# Patient Record
Sex: Female | Born: 1985
Health system: Southern US, Community
[De-identification: ages and names within clinical notes are randomized; demographics above are authoritative.]

## PROBLEM LIST (undated history)

## (undated) DIAGNOSIS — R011 Cardiac murmur, unspecified: Secondary | ICD-10-CM

## (undated) DIAGNOSIS — E079 Disorder of thyroid, unspecified: Secondary | ICD-10-CM

## (undated) DIAGNOSIS — Z8619 Personal history of other infectious and parasitic diseases: Secondary | ICD-10-CM

## (undated) DIAGNOSIS — J45909 Unspecified asthma, uncomplicated: Secondary | ICD-10-CM

## (undated) HISTORY — DX: Disorder of thyroid, unspecified: E07.9

## (undated) HISTORY — DX: Cardiac murmur, unspecified: R01.1

## (undated) HISTORY — PX: ABDOMINAL HYSTERECTOMY: SHX81

## (undated) HISTORY — DX: Unspecified asthma, uncomplicated: J45.909

## (undated) HISTORY — DX: Personal history of other infectious and parasitic diseases: Z86.19

---
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
## 2003-02-03 ENCOUNTER — Other Ambulatory Visit: Admission: RE | Admit: 2003-02-03 | Discharge: 2003-02-03 | Payer: Self-pay | Admitting: Family Medicine

## 2003-06-17 ENCOUNTER — Encounter: Admission: RE | Admit: 2003-06-17 | Discharge: 2003-06-17 | Payer: Self-pay | Admitting: Internal Medicine

## 2003-06-17 ENCOUNTER — Encounter: Payer: Self-pay | Admitting: Internal Medicine

## 2004-02-22 ENCOUNTER — Other Ambulatory Visit: Admission: RE | Admit: 2004-02-22 | Discharge: 2004-02-22 | Payer: Self-pay | Admitting: Family Medicine

## 2004-07-19 ENCOUNTER — Encounter: Admission: RE | Admit: 2004-07-19 | Discharge: 2004-07-19 | Payer: Self-pay | Admitting: Family Medicine

## 2004-10-02 ENCOUNTER — Ambulatory Visit: Payer: Self-pay | Admitting: Family Medicine

## 2005-01-15 ENCOUNTER — Ambulatory Visit: Payer: Self-pay | Admitting: Family Medicine

## 2005-02-12 DIAGNOSIS — J4520 Mild intermittent asthma, uncomplicated: Secondary | ICD-10-CM | POA: Insufficient documentation

## 2005-02-12 DIAGNOSIS — F411 Generalized anxiety disorder: Secondary | ICD-10-CM | POA: Insufficient documentation

## 2006-05-17 ENCOUNTER — Emergency Department (HOSPITAL_COMMUNITY): Admission: EM | Admit: 2006-05-17 | Discharge: 2006-05-17 | Payer: Self-pay | Admitting: Emergency Medicine

## 2007-07-08 ENCOUNTER — Ambulatory Visit: Payer: Self-pay | Admitting: Internal Medicine

## 2007-07-08 DIAGNOSIS — D649 Anemia, unspecified: Secondary | ICD-10-CM | POA: Insufficient documentation

## 2007-07-08 DIAGNOSIS — H60509 Unspecified acute noninfective otitis externa, unspecified ear: Secondary | ICD-10-CM | POA: Insufficient documentation

## 2007-07-08 DIAGNOSIS — H9209 Otalgia, unspecified ear: Secondary | ICD-10-CM | POA: Insufficient documentation

## 2007-07-08 DIAGNOSIS — H60519 Acute actinic otitis externa, unspecified ear: Secondary | ICD-10-CM | POA: Insufficient documentation

## 2007-07-10 ENCOUNTER — Encounter: Payer: Self-pay | Admitting: Internal Medicine

## 2007-07-16 ENCOUNTER — Ambulatory Visit: Payer: Self-pay | Admitting: Internal Medicine

## 2007-07-16 DIAGNOSIS — F4321 Adjustment disorder with depressed mood: Secondary | ICD-10-CM | POA: Insufficient documentation

## 2007-07-16 DIAGNOSIS — F909 Attention-deficit hyperactivity disorder, unspecified type: Secondary | ICD-10-CM | POA: Insufficient documentation

## 2007-07-16 DIAGNOSIS — E040 Nontoxic diffuse goiter: Secondary | ICD-10-CM | POA: Insufficient documentation

## 2007-07-16 LAB — CONVERTED CEMR LAB
Thyroglobulin Ab: 310.4 — ABNORMAL HIGH (ref 0.0–60.0)
Thyroperoxidase Ab SerPl-aCnc: 26981.5 — ABNORMAL HIGH (ref 0.0–60.0)
Vit D, 1,25-Dihydroxy: 29 — ABNORMAL LOW (ref 30–89)

## 2007-07-17 LAB — CONVERTED CEMR LAB
ALT: 19 units/L (ref 0–35)
AST: 20 units/L (ref 0–37)
Albumin: 4.5 g/dL (ref 3.5–5.2)
Alkaline Phosphatase: 79 units/L (ref 39–117)
BUN: 11 mg/dL (ref 6–23)
Basophils Absolute: 0 10*3/uL (ref 0.0–0.1)
Basophils Relative: 0.3 % (ref 0.0–1.0)
Bilirubin, Direct: 0.1 mg/dL (ref 0.0–0.3)
CO2: 25 meq/L (ref 19–32)
Calcium: 9.7 mg/dL (ref 8.4–10.5)
Chloride: 107 meq/L (ref 96–112)
Cholesterol: 155 mg/dL (ref 0–200)
Creatinine, Ser: 0.8 mg/dL (ref 0.4–1.2)
Eosinophils Absolute: 0.1 10*3/uL (ref 0.0–0.6)
Eosinophils Relative: 1.4 % (ref 0.0–5.0)
Ferritin: 4.9 ng/mL — ABNORMAL LOW (ref 10.0–291.0)
Free T4: 0.6 ng/dL (ref 0.6–1.6)
GFR calc Af Amer: 114 mL/min
GFR calc non Af Amer: 94 mL/min
Glucose, Bld: 78 mg/dL (ref 70–99)
HCT: 34.8 % — ABNORMAL LOW (ref 36.0–46.0)
HDL: 41.2 mg/dL (ref >39.0)
Hemoglobin: 11.8 g/dL — ABNORMAL LOW (ref 12.0–15.0)
LDL Cholesterol: 102 mg/dL — ABNORMAL HIGH (ref 0–99)
Lymphocytes Relative: 27.9 % (ref 12.0–46.0)
MCHC: 33.9 g/dL (ref 30.0–36.0)
MCV: 76.9 fL — ABNORMAL LOW (ref 78.0–100.0)
Monocytes Absolute: 0.5 10*3/uL (ref 0.2–0.7)
Monocytes Relative: 8.3 % (ref 3.0–11.0)
Neutro Abs: 3.9 10*3/uL (ref 1.4–7.7)
Neutrophils Relative %: 62.1 % (ref 43.0–77.0)
Platelets: 328 10*3/uL (ref 150–400)
Potassium: 5 meq/L (ref 3.5–5.1)
RBC: 4.52 M/uL (ref 3.87–5.11)
RDW: 16.4 % — ABNORMAL HIGH (ref 11.5–14.6)
Sodium: 140 meq/L (ref 135–145)
T3, Free: 2.7 pg/mL (ref 2.3–4.2)
TSH: 26.73 microintl units/mL — ABNORMAL HIGH (ref 0.35–5.50)
Total Bilirubin: 0.5 mg/dL (ref 0.3–1.2)
Total CHOL/HDL Ratio: 3.8
Total Protein: 7.6 g/dL (ref 6.0–8.3)
Triglycerides: 59 mg/dL (ref 0–149)
VLDL: 12 mg/dL (ref 0–40)
Vitamin B-12: 279 pg/mL (ref 211–911)
WBC: 6.3 10*3/uL (ref 4.5–10.5)

## 2007-07-22 ENCOUNTER — Telehealth: Payer: Self-pay | Admitting: Internal Medicine

## 2007-07-22 ENCOUNTER — Encounter: Payer: Self-pay | Admitting: Internal Medicine

## 2007-08-25 ENCOUNTER — Telehealth: Payer: Self-pay | Admitting: Internal Medicine

## 2007-10-02 ENCOUNTER — Ambulatory Visit: Payer: Self-pay | Admitting: Internal Medicine

## 2007-10-03 LAB — CONVERTED CEMR LAB
Free T4: 0.7 ng/dL (ref 0.6–1.6)
TSH: 5.05 microintl units/mL (ref 0.35–5.50)

## 2007-10-08 ENCOUNTER — Telehealth: Payer: Self-pay | Admitting: Internal Medicine

## 2007-10-31 ENCOUNTER — Ambulatory Visit: Payer: Self-pay | Admitting: Internal Medicine

## 2007-10-31 DIAGNOSIS — M545 Low back pain, unspecified: Secondary | ICD-10-CM | POA: Insufficient documentation

## 2007-10-31 DIAGNOSIS — B070 Plantar wart: Secondary | ICD-10-CM | POA: Insufficient documentation

## 2007-10-31 LAB — CONVERTED CEMR LAB
Bilirubin Urine: NEGATIVE
Glucose, Urine, Semiquant: NEGATIVE
Ketones, urine, test strip: NEGATIVE
Nitrite: NEGATIVE
Protein, U semiquant: NEGATIVE
Specific Gravity, Urine: 1.015
Urobilinogen, UA: 0.2
pH: 6

## 2007-11-01 ENCOUNTER — Encounter: Payer: Self-pay | Admitting: Internal Medicine

## 2007-12-18 ENCOUNTER — Ambulatory Visit: Payer: Self-pay | Admitting: Internal Medicine

## 2007-12-18 DIAGNOSIS — Z8639 Personal history of other endocrine, nutritional and metabolic disease: Secondary | ICD-10-CM | POA: Insufficient documentation

## 2007-12-18 DIAGNOSIS — IMO0002 Reserved for concepts with insufficient information to code with codable children: Secondary | ICD-10-CM | POA: Insufficient documentation

## 2007-12-24 LAB — CONVERTED CEMR LAB: TSH: 3.18 microintl units/mL (ref 0.35–5.50)

## 2008-04-29 ENCOUNTER — Emergency Department (HOSPITAL_BASED_OUTPATIENT_CLINIC_OR_DEPARTMENT_OTHER): Admission: EM | Admit: 2008-04-29 | Discharge: 2008-04-29 | Payer: Self-pay | Admitting: Emergency Medicine

## 2008-06-09 ENCOUNTER — Telehealth: Payer: Self-pay | Admitting: Internal Medicine

## 2008-07-06 ENCOUNTER — Ambulatory Visit: Payer: Self-pay | Admitting: Internal Medicine

## 2008-09-03 ENCOUNTER — Ambulatory Visit: Payer: Self-pay | Admitting: Internal Medicine

## 2008-09-24 HISTORY — PX: CHOLECYSTECTOMY: SHX55

## 2008-10-01 ENCOUNTER — Ambulatory Visit: Payer: Self-pay | Admitting: Family Medicine

## 2008-10-01 DIAGNOSIS — L219 Seborrheic dermatitis, unspecified: Secondary | ICD-10-CM | POA: Insufficient documentation

## 2008-11-12 ENCOUNTER — Ambulatory Visit: Payer: Self-pay | Admitting: Internal Medicine

## 2008-11-12 DIAGNOSIS — E069 Thyroiditis, unspecified: Secondary | ICD-10-CM | POA: Insufficient documentation

## 2008-11-12 DIAGNOSIS — R82998 Other abnormal findings in urine: Secondary | ICD-10-CM | POA: Insufficient documentation

## 2008-11-12 LAB — CONVERTED CEMR LAB
Bilirubin Urine: NEGATIVE
Glucose, Urine, Semiquant: NEGATIVE
Ketones, urine, test strip: NEGATIVE
Nitrite: NEGATIVE
Protein, U semiquant: NEGATIVE
Specific Gravity, Urine: 1.03
Urobilinogen, UA: 0.2
pH: 5

## 2008-11-13 ENCOUNTER — Encounter: Payer: Self-pay | Admitting: Internal Medicine

## 2008-11-17 LAB — CONVERTED CEMR LAB
Free T4: 0.7 ng/dL (ref 0.6–1.6)
T3, Free: 3.3 pg/mL (ref 2.3–4.2)
TSH: 2.63 microintl units/mL (ref 0.35–5.50)

## 2008-11-19 ENCOUNTER — Telehealth: Payer: Self-pay | Admitting: Family Medicine

## 2009-01-10 ENCOUNTER — Telehealth: Payer: Self-pay | Admitting: Internal Medicine

## 2009-01-12 ENCOUNTER — Ambulatory Visit: Payer: Self-pay | Admitting: Internal Medicine

## 2009-01-17 LAB — CONVERTED CEMR LAB
Free T4: 0.8 ng/dL (ref 0.6–1.6)
TSH: 2.72 microintl units/mL (ref 0.35–5.50)

## 2009-02-01 ENCOUNTER — Emergency Department (HOSPITAL_BASED_OUTPATIENT_CLINIC_OR_DEPARTMENT_OTHER): Admission: EM | Admit: 2009-02-01 | Discharge: 2009-02-01 | Payer: Self-pay | Admitting: Emergency Medicine

## 2009-05-06 ENCOUNTER — Telehealth: Payer: Self-pay | Admitting: *Deleted

## 2009-07-22 ENCOUNTER — Telehealth: Payer: Self-pay | Admitting: *Deleted

## 2009-07-22 ENCOUNTER — Encounter (INDEPENDENT_AMBULATORY_CARE_PROVIDER_SITE_OTHER): Payer: Self-pay | Admitting: *Deleted

## 2009-12-23 ENCOUNTER — Ambulatory Visit: Payer: Self-pay | Admitting: Internal Medicine

## 2009-12-23 DIAGNOSIS — R454 Irritability and anger: Secondary | ICD-10-CM | POA: Insufficient documentation

## 2009-12-23 DIAGNOSIS — F4323 Adjustment disorder with mixed anxiety and depressed mood: Secondary | ICD-10-CM | POA: Insufficient documentation

## 2009-12-23 LAB — CONVERTED CEMR LAB
Cholesterol: 112 mg/dL (ref 0–200)
HDL: 43.8 mg/dL (ref >39.00)
Hgb A1c MFr Bld: 5.2 % (ref 4.6–6.5)
LDL Cholesterol: 54 mg/dL (ref 0–99)
Total CHOL/HDL Ratio: 3
Triglycerides: 70 mg/dL (ref 0.0–149.0)
VLDL: 14 mg/dL (ref 0.0–40.0)
hCG, Beta Chain, Quant, S: 1.18 milliintl units/mL

## 2009-12-26 ENCOUNTER — Telehealth: Payer: Self-pay | Admitting: Internal Medicine

## 2009-12-26 LAB — CONVERTED CEMR LAB
ALT: 19 units/L (ref 0–35)
AST: 15 units/L (ref 0–37)
Albumin: 4.3 g/dL (ref 3.5–5.2)
Alkaline Phosphatase: 43 units/L (ref 39–117)
BUN: 9 mg/dL (ref 6–23)
Basophils Absolute: 0 10*3/uL (ref 0.0–0.1)
Basophils Relative: 0.3 % (ref 0.0–3.0)
Bilirubin, Direct: 0 mg/dL (ref 0.0–0.3)
CO2: 23 meq/L (ref 19–32)
Calcium: 8.7 mg/dL (ref 8.4–10.5)
Chloride: 104 meq/L (ref 96–112)
Creatinine, Ser: 0.6 mg/dL (ref 0.4–1.2)
Eosinophils Absolute: 0.1 10*3/uL (ref 0.0–0.7)
Eosinophils Relative: 1.1 % (ref 0.0–5.0)
Free T4: 0.7 ng/dL (ref 0.6–1.6)
GFR calc non Af Amer: 128.63 mL/min (ref >60)
Glucose, Bld: 71 mg/dL (ref 70–99)
HCT: 38 % (ref 36.0–46.0)
Hemoglobin: 13 g/dL (ref 12.0–15.0)
Lymphocytes Relative: 24.8 % (ref 12.0–46.0)
Lymphs Abs: 1.4 10*3/uL (ref 0.7–4.0)
MCHC: 34.3 g/dL (ref 30.0–36.0)
MCV: 83.5 fL (ref 78.0–100.0)
Monocytes Absolute: 0.4 10*3/uL (ref 0.1–1.0)
Monocytes Relative: 7.4 % (ref 3.0–12.0)
Neutro Abs: 3.7 10*3/uL (ref 1.4–7.7)
Neutrophils Relative %: 66.4 % (ref 43.0–77.0)
Platelets: 249 10*3/uL (ref 150.0–400.0)
Potassium: 3.4 meq/L — ABNORMAL LOW (ref 3.5–5.1)
RBC: 4.55 M/uL (ref 3.87–5.11)
RDW: 13.3 % (ref 11.5–14.6)
Sodium: 132 meq/L — ABNORMAL LOW (ref 135–145)
T3, Free: 2.6 pg/mL (ref 2.3–4.2)
TSH: 2.5 microintl units/mL (ref 0.35–5.50)
Total Bilirubin: 0.3 mg/dL (ref 0.3–1.2)
Total Protein: 7.8 g/dL (ref 6.0–8.3)
WBC: 5.5 10*3/uL (ref 4.5–10.5)

## 2009-12-29 ENCOUNTER — Ambulatory Visit: Payer: Self-pay | Admitting: Licensed Clinical Social Worker

## 2009-12-30 ENCOUNTER — Telehealth: Payer: Self-pay | Admitting: *Deleted

## 2010-03-08 ENCOUNTER — Emergency Department (HOSPITAL_BASED_OUTPATIENT_CLINIC_OR_DEPARTMENT_OTHER): Admission: EM | Admit: 2010-03-08 | Discharge: 2010-03-08 | Payer: Self-pay | Admitting: Emergency Medicine

## 2010-06-06 ENCOUNTER — Telehealth: Payer: Self-pay | Admitting: *Deleted

## 2010-06-06 ENCOUNTER — Encounter: Payer: Self-pay | Admitting: *Deleted

## 2010-07-14 ENCOUNTER — Ambulatory Visit: Payer: Self-pay | Admitting: Internal Medicine

## 2010-09-24 HISTORY — PX: TUBAL LIGATION: SHX77

## 2010-10-24 NOTE — Assessment & Plan Note (Signed)
Summary: flu-shot/rcd  Nurse Visit   Allergies: 1)  ! Morphine  Orders Added: 1)  Admin 1st Vaccine [90471] 2)  Flu Vaccine 25yrs + [40347]     Flu Vaccine Consent Questions     Do you have a history of severe allergic reactions to this vaccine? no    Any prior history of allergic reactions to egg and/or gelatin? no    Do you have a sensitivity to the preservative Thimersol? no    Do you have a past history of Guillan-Barre Syndrome? no    Do you currently have an acute febrile illness? no    Have you ever had a severe reaction to latex? no    Vaccine information given and explained to patient? yes    Are you currently pregnant? no    Lot Number:[id number]   Exp Date:03/24/2011   Site Given  Left Deltoid IM Romualdo Bolk, CMA (AAMA)  July 14, 2010 1:58 PM

## 2010-10-24 NOTE — Progress Notes (Signed)
Summary: Appt with Psych on May 18th  Phone Note Call from Patient Call back at [phone]   Caller: Patient Summary of Call: Pt called to say that the first person she called was a couselor. She is going to see a psychologist on May 18th and would like to try some type of medication if possible. She did see Darl Pikes yesterday.  Appt is with Dr. Evelene Croon. Initial call taken by: Romualdo Bolk, CMA Duncan Dull),  December 30, 2009 11:51 AM  Follow-up for Phone Call        Pt aware that md is going to discuss this with Judithe Modest first. Follow-up by: Romualdo Bolk, CMA Duncan Dull),  December 30, 2009 1:08 PM  Additional Follow-up for Phone Call Additional follow up Details #1::        I spoke  with Darl Pikes and asked  on current information    I rec we try to get you on the cancellation list  per Dr Evelene Croon to get seen earlier for med  possiblity.   Still unclear what medication would be best .  No quick fixes Additional Follow-up by: Madelin Headings MD,  January 03, 2010 5:41 PM    Additional Follow-up for Phone Call Additional follow up Details #2::    Pt aware of this and will call to see about a cancellation list. Follow-up by: Romualdo Bolk, CMA (AAMA),  January 04, 2010 8:24 AM

## 2010-10-24 NOTE — Assessment & Plan Note (Signed)
Summary: DEPRESSION / CONCERNS W/ DIABETES // RS   Vital Signs:  Patient profile:   25 year old female Menstrual status:  regular LMP:     12/06/2009 Height:      66 inches Weight:      150 pounds BMI:     24.30 Pulse rate:   72 / minute BP sitting:   110 / 80  (right arm) Cuff size:   regular  Vitals Entered By: Romualdo Bolk, CMA (AAMA) (December 23, 2009 11:10 AM) CC: Discuss depression and wants to have labs done. Pt is fasting., Depression, Pt is wondering if she is bi-polar. She would like to be referred to some. LMP (date): 12/06/2009 LMP - Character: unsure IUD Menarche (age onset years): 10   Menses interval (days): 28 Menstrual flow (days): 3-4 Menstrual Status regular Enter LMP: 12/06/2009   History of Present Illness: Tracy Mejia comes in today for   above. She is here with husband and friend.  Interivew with her separate and then with them.    Problems for a long time  .    With volatility and  now  Others are    commenting on her moodiness and   inmpulsivity  and  irritability  at times  .  She says she needs help with this  .  Currently she has   Lots of responsibility .  Is a control  issue person.   Get angry and hard to deal with .    takes care of her 3 Yo and step children at times that she cannot control .    Physical     involvement.  Feels left out in marriage but  not abused.    Denies substance use abuse .  Wants to be checked for Dm as pos fam hx of such and  make sure thyroid etc ok.   past hx of rx and dex for adhd as a young child  .   Med probelms incude  hs of thyroid aby pos  thyroiditis, recurrent utis and ear oe problems .    Depression History:      The patient denies a depressed mood most of the day but notes a diminished interest in her usual daily activities.  Positive alarm features for depression include hypersomnia, fatigue (loss of energy), feelings of worthlessness (guilt), impaired concentration (indecisiveness), and recurrent  thoughts of death or suicide.  However, she denies significant weight loss and significant weight gain.  Positive alarm features for a manic disorder include persistently & abnormally elevated mood, abnormally & persistently irritable mood, distractibility, psychomotor agitation, and excessive sexual indiscretions.  She denies less need for sleep, talkative or feels need to keep talking, flight of ideas, increase in goal-directed activity, inflated self-esteem or grandiosity, excessive buying sprees, and excessive foolish business investments.        Risk factors for depression include a family history of depression.  Suicide risk questions reveal that she does not feel like life is worth living, she wishes that she were dead, and she has thought about ending her life.  The patient denies that she has planned how to end her life.  Due to her current symptoms, it often takes extra effort to do the things she needs to do.        Comments:  Pt has thought killing can't do it because of her child. Pt has also gets angry easy for no reason. .    Preventive Screening-Counseling & Management  Alcohol-Tobacco     Alcohol drinks/day: 0     Smoking Status: quit     Year Quit: 2007  Caffeine-Diet-Exercise     Caffeine use/day: 4-5     Does Patient Exercise: no  Current Medications (verified): 1)  Levothyroxine Sodium 112 Mcg Tabs (Levothyroxine Sodium) .Marland Kitchen.. 1 By Mouth Once Daily  Allergies (verified): 1)  ! Morphine  Past History:  Past medical, surgical, family and social histories (including risk factors) reviewed, and no changes noted (except as noted below).  Past Medical History: Anemia-NOS heart murmur probably functional childbirth times one Thyroiditis Dx of ADHD as a child  ( no records)  [facility]  Past Surgical History: Reviewed history from 07/08/2007 and no changes required. wisdom teeth  Past History:  Care Management: Gynecology: [facility]  Family History: Reviewed history from 11/12/2008 and no changes required. Family History Depression Family History Diabetes 1st degree relative Family History High cholesterol Family History Hypertension Family History of Melanoma Family History Other cancer Brain Family History of Prostate CA 1st degree relative <50   Social History: Reviewed history from 11/12/2008 and no changes required. Occupation: Homemaker Married  one child   step children  in home at times  Former Smoker Alcohol use-no Drug use-no Regular exercise-no  Caffeine use/day:  4-5  Review of Systems       The patient complains of depression.  The patient denies anorexia, fever, weight loss, weight gain, vision loss, decreased hearing, hoarseness, chest pain, syncope, dyspnea on exertion, peripheral edema, prolonged cough, headaches, hemoptysis, abdominal pain, melena, hematochezia, severe indigestion/heartburn, hematuria, muscle weakness, transient blindness, difficulty walking, unusual weight change, abnormal bleeding, enlarged lymph nodes, angioedema, and breast masses.         left ear bothers her an itches at times   Physical Exam  General:  alert, well-developed, well-nourished, and well-hydrated.  tearful at times  Head:  normocephalic and atraumatic.   Eyes:  vision grossly intact, pupils equal, pupils round, and pupils reactive to light.   Ears:  R ear normal.  leaft ear with 1+ wax no redness or discharge  Nose:  no external deformity and no nasal discharge.   Mouth:  pharynx pink and moist.   Neck:  No deformities, or tenderness noted. Lungs:  Normal respiratory effort, chest expands symmetrically. Lungs are clear to auscultation, no crackles or wheezes.no dullness.   Heart:  Normal rate and regular rhythm. S1 and S2 normal without gallop, murmur, click, rub or other extra sounds.no lifts.   Abdomen:  Bowel sounds positive,abdomen soft and non-tender without masses, organomegaly or    noted. Msk:  normal ROM, no joint warmth, and no redness over joints.   Pulses:  pulses intact without delay   Extremities:  no clubbing cyanosis or edema  Neurologic:  alert & oriented X3, strength normal in all extremities, and gait normal.   Skin:  turgor normal, color normal, no ecchymoses, no petechiae, and no purpura.   Cervical Nodes:  No lymphadenopathy noted Psych:  Oriented X3, good eye contact, not agitated, not suicidal, but tearful and distressed .    nl cognition  and speech and though    Impression & Recommendations:  Problem # 1:  IRRITABILITY (HQI-696.29) r/o metabolic .   consider bipolar other  depressive rx.     Orders: TLB-A1C / Hgb A1C (Glycohemoglobin) (83036-A1C)  Problem # 2:  ADJ DISORDER WITH MIXED ANXIETY & DEPRESSED MOOD (ICD-309.28)  needs psychiatry for med consult and  counseling also .  Orders: TLB-BMP (Basic Metabolic Panel-BMET) (80048-METABOL) TLB-CBC Platelet - w/Differential (85025-CBCD) TLB-Hepatic/Liver Function Pnl (80076-HEPATIC) TLB-TSH (Thyroid Stimulating Hormone) (84443-TSH) TLB-T4 (Thyrox), Free [phone]) TLB-T3, Free (Triiodothyronine) (84481-T3FREE)  Problem # 3:  THYROIDITIS (ICD-245.9) has had nl tft s in the past year   prob not contributing that much to above problem which has been lifelong and agg by external factors  Orders: TLB-BMP (Basic Metabolic Panel-BMET) (80048-METABOL) TLB-CBC Platelet - w/Differential (85025-CBCD) TLB-Hepatic/Liver Function Pnl (80076-HEPATIC) TLB-TSH (Thyroid Stimulating Hormone) (84443-TSH) TLB-T4 (Thyrox), Free (84439-FT4R) TLB-T3, Free (Triiodothyronine) (84481-T3FREE)  Problem # 4:  ear issue  try debrox or other otc.  q hs and  if persistent and progressive  recheck    Complete Medication List: 1)  Levothyroxine Sodium 112 Mcg Tabs (Levothyroxine sodium) .Marland Kitchen.. 1 by mouth once daily  Other Orders: Venipuncture (40981) TLB-Preg Serum Quant (B-hCG) (84702-HCG-QN) TLB-Lipid Panel  (80061-LIPID) pateint not using Bc  and considering another pregnancy and child.. Advised   getting  her current situation stable before   pregnancy    consideration as things usually get worse  with this situation.  Patient Instructions: 1)  You will be informed of lab results when available.  2)  I recommend counseling . 3)  I want your  to see a psychiatrist for a medication consult. 4)  Call with appt date and we will decide to start med in the meantime   5)  some meds can make thinks worse and others not.

## 2010-10-24 NOTE — Letter (Signed)
Summary: Generic Letter  [place] [facility]  [address] [place], [place] [address]   Phone: [phone]  Fax: [phone]    06/06/2010  Attn: Dr. Sol Blazing, DOB: [birth year], does not need antibiotics prior to any dental procedures due to her heart murmur. If you have any questions, please give our office a call at [phone].  Sincerely,       Dr. Neta Mends. Panosh

## 2010-10-24 NOTE — Progress Notes (Signed)
Summary: Note saying no antibiotic is needed for heart murmur  Phone Note Call from Patient Call back at Work Phone [phone]   Caller: Patient Summary of Call: Pt needs a note from [place] saying that she doesn't need any antibiotics prior to having her braces put on or any dental treatment for her heart murmur. Can we do this?  Fax note to [phone]. Dr.Baily Initial call taken by: Romualdo Bolk, CMA Duncan Dull),  June 06, 2010 10:21 AM  Follow-up for Phone Call        yes  no need for antibioitc prophylaxis Follow-up by: Madelin Headings MD,  June 06, 2010 1:05 PM  Additional Follow-up for Phone Call Additional follow up Details #1::        Note faxed to md. Additional Follow-up by: Romualdo Bolk, CMA Duncan Dull),  June 06, 2010 1:45 PM

## 2010-10-24 NOTE — Progress Notes (Signed)
Summary: Appt with pysch on april 15th  Phone Note Call from Patient Call back at [facility] Phone [phone]   Caller: Patient Summary of Call: Pt has an appt with pysch on April 15th. She is also going to see Darl Pikes on thurs. Initial call taken by: Romualdo Bolk, CMA (AAMA),  December 26, 2009 2:01 PM

## 2010-12-10 LAB — LIPASE, BLOOD: Lipase: 29 U/L (ref 23–300)

## 2010-12-10 LAB — COMPREHENSIVE METABOLIC PANEL
ALT: 62 U/L — ABNORMAL HIGH (ref 0–35)
AST: 28 U/L (ref 0–37)
Albumin: 3.9 g/dL (ref 3.5–5.2)
Alkaline Phosphatase: 73 U/L (ref 39–117)
BUN: 11 mg/dL (ref 6–23)
CO2: 25 mEq/L (ref 19–32)
Calcium: 8.7 mg/dL (ref 8.4–10.5)
Chloride: 107 mEq/L (ref 96–112)
Creatinine, Ser: 0.6 mg/dL (ref 0.4–1.2)
GFR calc Af Amer: >60 mL/min (ref >60)
GFR calc non Af Amer: >60 mL/min (ref >60)
Glucose, Bld: 84 mg/dL (ref 70–99)
Potassium: 3.3 mEq/L — ABNORMAL LOW (ref 3.5–5.1)
Sodium: 146 mEq/L — ABNORMAL HIGH (ref 135–145)
Total Bilirubin: 0.3 mg/dL (ref 0.3–1.2)
Total Protein: 7.6 g/dL (ref 6.0–8.3)

## 2010-12-10 LAB — DIFFERENTIAL
Basophils Absolute: 0.1 10*3/uL (ref 0.0–0.1)
Basophils Relative: 2 % — ABNORMAL HIGH (ref 0–1)
Eosinophils Absolute: 0.1 10*3/uL (ref 0.0–0.7)
Eosinophils Relative: 1 % (ref 0–5)
Lymphocytes Relative: 29 % (ref 12–46)
Lymphs Abs: 1.2 10*3/uL (ref 0.7–4.0)
Monocytes Absolute: 0.5 10*3/uL (ref 0.1–1.0)
Monocytes Relative: 12 % (ref 3–12)
Neutro Abs: 2.1 10*3/uL (ref 1.7–7.7)
Neutrophils Relative %: 57 % (ref 43–77)

## 2010-12-10 LAB — URINALYSIS, ROUTINE W REFLEX MICROSCOPIC
Glucose, UA: NEGATIVE mg/dL
Hgb urine dipstick: NEGATIVE
Ketones, ur: NEGATIVE mg/dL
Nitrite: NEGATIVE
Protein, ur: NEGATIVE mg/dL
Specific Gravity, Urine: 1.029 (ref 1.005–1.030)
Urobilinogen, UA: 1 mg/dL (ref 0.0–1.0)
pH: 5.5 (ref 5.0–8.0)

## 2010-12-10 LAB — CBC
HCT: 36.1 % (ref 36.0–46.0)
Hemoglobin: 12.3 g/dL (ref 12.0–15.0)
MCHC: 34.1 g/dL (ref 30.0–36.0)
MCV: 83.5 fL (ref 78.0–100.0)
Platelets: 216 10*3/uL (ref 150–400)
RBC: 4.32 MIL/uL (ref 3.87–5.11)
RDW: 11.9 % (ref 11.5–15.5)
WBC: 4 10*3/uL (ref 4.0–10.5)

## 2010-12-10 LAB — PREGNANCY, URINE: Preg Test, Ur: NEGATIVE

## 2012-10-10 ENCOUNTER — Ambulatory Visit (INDEPENDENT_AMBULATORY_CARE_PROVIDER_SITE_OTHER): Payer: PRIVATE HEALTH INSURANCE | Admitting: Family

## 2012-10-10 ENCOUNTER — Encounter: Payer: Self-pay | Admitting: Family

## 2012-10-10 VITALS — BP 110/86 | HR 90 | Temp 97.8°F | Resp 16 | Ht 66.5 in | Wt 174.0 lb

## 2012-10-10 DIAGNOSIS — H6090 Unspecified otitis externa, unspecified ear: Secondary | ICD-10-CM

## 2012-10-10 DIAGNOSIS — Z8639 Personal history of other endocrine, nutritional and metabolic disease: Secondary | ICD-10-CM

## 2012-10-10 DIAGNOSIS — F3289 Other specified depressive episodes: Secondary | ICD-10-CM

## 2012-10-10 DIAGNOSIS — H60399 Other infective otitis externa, unspecified ear: Secondary | ICD-10-CM

## 2012-10-10 DIAGNOSIS — Z862 Personal history of diseases of the blood and blood-forming organs and certain disorders involving the immune mechanism: Secondary | ICD-10-CM

## 2012-10-10 DIAGNOSIS — R358 Other polyuria: Secondary | ICD-10-CM

## 2012-10-10 DIAGNOSIS — R51 Headache: Secondary | ICD-10-CM

## 2012-10-10 DIAGNOSIS — E039 Hypothyroidism, unspecified: Secondary | ICD-10-CM

## 2012-10-10 DIAGNOSIS — R3589 Other polyuria: Secondary | ICD-10-CM

## 2012-10-10 DIAGNOSIS — F329 Major depressive disorder, single episode, unspecified: Secondary | ICD-10-CM

## 2012-10-10 DIAGNOSIS — F32A Depression, unspecified: Secondary | ICD-10-CM

## 2012-10-10 LAB — TSH: TSH: 2.613 u[IU]/mL (ref 0.350–4.500)

## 2012-10-10 LAB — BASIC METABOLIC PANEL
BUN: 6 mg/dL (ref 6–23)
CO2: 25 mEq/L (ref 19–32)
Calcium: 9.3 mg/dL (ref 8.4–10.5)
Chloride: 105 mEq/L (ref 96–112)
Creat: 0.59 mg/dL (ref 0.50–1.10)
Glucose, Bld: 78 mg/dL (ref 70–99)
Potassium: 4 mEq/L (ref 3.5–5.3)
Sodium: 138 mEq/L (ref 135–145)

## 2012-10-10 MED ORDER — NEOMYCIN-POLYMYXIN-HC 1 % OT SOLN: 3.0000 [drp] | Freq: Four times a day (QID) | OTIC | Status: DC

## 2012-10-10 MED ORDER — VENLAFAXINE HCL ER 37.5 MG PO CP24: 37.5000 mg | ORAL_CAPSULE | Freq: Every day | ORAL | Status: DC

## 2012-10-10 NOTE — Progress Notes (Signed)
Subjective:    Patient ID: Tracy Mejia, female    DOB: 1986-07-03, 27 y.o.   MRN: [id number]  HPI  Pt presents today to establish care.  Reports some ear pain and drainage.    Hypothyroid-  Off of meds x 2 yrs.  Reports some weight gain.  Reports that her baseline weight was 160.    Depression-  Reports that she has been on lexapro and wellbutrin in the past without improvement. She has never seen psychiatry.   History of heart murmur.    Headaches- daily x 1 week.  Goes away x 1 hour.  + frontal headache.  She denies migraine history. She denies associated nausea.  Denies photo/phonophobia.  Denies nasal congestion.       Review of Systems  Constitutional: Negative for unexpected weight change.  HENT: Negative for hearing loss and congestion.   Eyes: Negative for visual disturbance.  Respiratory: Negative for cough.   Cardiovascular: Negative for leg swelling.  Gastrointestinal: Negative for nausea, vomiting and diarrhea.  Genitourinary: Negative for dysuria and frequency.  Musculoskeletal:       Joint pain in fingers/knees.  Neurological: Positive for headaches.  Hematological: Negative for adenopathy.  Psychiatric/Behavioral:       Reports tearfulness.     Past Medical History  Diagnosis Date  . Thyroid disease   . Asthma     exercise induced  . Heart murmur   . History of chicken pox     History   Social History  . Marital Status: Married    Spouse Name: N/A    Number of Children: N/A  . Years of Education: N/A   Occupational History  . Not on file.   Social History Main Topics  . Smoking status: Current Every Day Smoker    Types: Cigarettes  . Smokeless tobacco: Never Used     Comment: 4-5 (1 pack weekly) cigarettes daily  . Alcohol Use: Yes     Comment: occasional  . Drug Use: Not on file  . Sexually Active: Not on file   Other Topics Concern  . Not on file   Social History Narrative   Live husband 2 children and her parentsStay at home  momCompleted high school.  Enjoys movies/dinner out etc.    Past Surgical History  Procedure Date  . Cholecystectomy 2010  . Tubal ligation 2012  . Wisdom tooth extraction 2003    Family History  Problem Relation Age of Onset  . Diabetes Mother   . Hypertension Mother   . Cancer Mother     breast  . Stroke Father   . Factor V Leiden deficiency Father   . Cancer Paternal Grandfather     melanoma, prostate cancer  . Heart disease Neg Hx     Allergies  Allergen Reactions  . Cortisone     Pt reports swelling and discoloration at previous injection sites. Decreased pulse in foot.  . Morphine     Pt reports tension in back and difficulty moving.    Current Outpatient Prescriptions on File Prior to Visit  Medication Sig Dispense Refill  . albuterol (PROVENTIL HFA;VENTOLIN HFA) 108 (90 BASE) MCG/ACT inhaler Inhale 2 puffs into the lungs every 6 (six) hours as needed.        BP 110/86  Pulse 90  Temp 97.8 F (36.6 C) (Oral)  Resp 16  Ht 5' 6.5" (1.689 m)  Wt 174 lb (78.926 kg)  BMI 27.66 kg/m2  SpO2 97%  LMP 10/03/2012  Objective:   Physical Exam  Constitutional: She is oriented to person, place, and time. She appears well-developed and well-nourished. No distress.  HENT:  Head: Normocephalic and atraumatic.       Some yellow/white exudates in bilateral ear canals  Eyes: No scleral icterus.  Cardiovascular: Normal rate and regular rhythm.   No murmur heard. Pulmonary/Chest: Breath sounds normal. No respiratory distress. She has no wheezes. She has no rales. She exhibits no tenderness.  Musculoskeletal: She exhibits no edema.  Lymphadenopathy:    She has no cervical adenopathy.  Neurological: She is alert and oriented to person, place, and time.  Psychiatric: She has a normal mood and affect. Her behavior is normal. Judgment and thought content normal.          Assessment & Plan:

## 2012-10-10 NOTE — Patient Instructions (Addendum)
Please follow up in 1 month. Complete your blood work prior to leaving.

## 2012-10-13 ENCOUNTER — Encounter: Payer: Self-pay | Admitting: Family

## 2012-10-15 ENCOUNTER — Encounter: Payer: Self-pay | Admitting: Family

## 2012-10-15 DIAGNOSIS — R51 Headache: Secondary | ICD-10-CM

## 2012-10-15 DIAGNOSIS — F32A Depression, unspecified: Secondary | ICD-10-CM | POA: Insufficient documentation

## 2012-10-15 DIAGNOSIS — R519 Headache, unspecified: Secondary | ICD-10-CM | POA: Insufficient documentation

## 2012-10-15 DIAGNOSIS — F329 Major depressive disorder, single episode, unspecified: Secondary | ICD-10-CM | POA: Insufficient documentation

## 2012-10-15 DIAGNOSIS — H6090 Unspecified otitis externa, unspecified ear: Secondary | ICD-10-CM | POA: Insufficient documentation

## 2012-10-15 NOTE — Assessment & Plan Note (Signed)
Will rx with cortisporin otic (she tells me she is able to use hydrocortisone cream without reaction)

## 2012-10-15 NOTE — Assessment & Plan Note (Signed)
TSH WNL-  Continue to monitor off of meds.

## 2012-10-15 NOTE — Assessment & Plan Note (Signed)
Recommend that she try wearing her prescription glasses.

## 2012-10-15 NOTE — Assessment & Plan Note (Signed)
Will give trial of effexor.

## 2012-10-16 ENCOUNTER — Encounter: Payer: Self-pay | Admitting: Family

## 2012-10-17 ENCOUNTER — Encounter: Payer: Self-pay | Admitting: Family

## 2012-10-30 ENCOUNTER — Encounter: Payer: Self-pay | Admitting: Family

## 2012-10-31 NOTE — Telephone Encounter (Signed)
Please call pt and arrange follow up visit.  Please make sure she is not having any thoughts of hurting herself or others.

## 2012-10-31 NOTE — Telephone Encounter (Signed)
Spoke with pt, she denies suicidal or homicidal ideation. Scheduled pt appt for Monday at 1:30pm.

## 2012-11-03 ENCOUNTER — Encounter: Payer: Self-pay | Admitting: Family

## 2012-11-03 ENCOUNTER — Ambulatory Visit (INDEPENDENT_AMBULATORY_CARE_PROVIDER_SITE_OTHER): Payer: PRIVATE HEALTH INSURANCE | Admitting: Family

## 2012-11-03 VITALS — BP 102/80 | HR 84 | Temp 97.6°F | Resp 16 | Wt 169.0 lb

## 2012-11-03 DIAGNOSIS — F3289 Other specified depressive episodes: Secondary | ICD-10-CM

## 2012-11-03 DIAGNOSIS — F32A Depression, unspecified: Secondary | ICD-10-CM

## 2012-11-03 DIAGNOSIS — R5383 Other fatigue: Secondary | ICD-10-CM

## 2012-11-03 DIAGNOSIS — J069 Acute upper respiratory infection, unspecified: Secondary | ICD-10-CM

## 2012-11-03 DIAGNOSIS — R5381 Other malaise: Secondary | ICD-10-CM

## 2012-11-03 DIAGNOSIS — F329 Major depressive disorder, single episode, unspecified: Secondary | ICD-10-CM

## 2012-11-03 LAB — CBC WITH DIFFERENTIAL/PLATELET
Basophils Absolute: 0 10*3/uL (ref 0.0–0.1)
Basophils Relative: 1 % (ref 0–1)
Eosinophils Absolute: 0.2 10*3/uL (ref 0.0–0.7)
Eosinophils Relative: 3 % (ref 0–5)
HCT: 35.8 % — ABNORMAL LOW (ref 36.0–46.0)
Hemoglobin: 12 g/dL (ref 12.0–15.0)
Lymphocytes Relative: 24 % (ref 12–46)
Lymphs Abs: 1.5 10*3/uL (ref 0.7–4.0)
MCH: 25.3 pg — ABNORMAL LOW (ref 26.0–34.0)
MCHC: 33.5 g/dL (ref 30.0–36.0)
MCV: 75.5 fL — ABNORMAL LOW (ref 78.0–100.0)
Monocytes Absolute: 0.8 10*3/uL (ref 0.1–1.0)
Monocytes Relative: 13 % — ABNORMAL HIGH (ref 3–12)
Neutro Abs: 3.8 10*3/uL (ref 1.7–7.7)
Neutrophils Relative %: 59 % (ref 43–77)
Platelets: 294 10*3/uL (ref 150–400)
RBC: 4.74 MIL/uL (ref 3.87–5.11)
RDW: 14.9 % (ref 11.5–15.5)
WBC: 6.3 10*3/uL (ref 4.0–10.5)

## 2012-11-03 MED ORDER — VENLAFAXINE HCL 37.5 MG PO TABS: 37.5000 mg | ORAL_TABLET | Freq: Two times a day (BID) | ORAL | Status: DC

## 2012-11-03 MED ORDER — SERTRALINE HCL 50 MG PO TABS: 50.0000 mg | ORAL_TABLET | Freq: Every day | ORAL | Status: DC

## 2012-11-03 NOTE — Assessment & Plan Note (Signed)
3 day hx URI symptoms. Recommended that she call if symptoms worsen or if not resolved by the 7th day. Check CBC to exclude anemia, but I suspect that her fatigue is related to stress and URI.

## 2012-11-03 NOTE — Progress Notes (Signed)
Subjective:    Patient ID: Tracy Mejia, female    DOB: 07-22-1986, 27 y.o.   MRN: [id number]  HPI  Depression-  About 3 days after she stared the effexor she noted improvement in her mood.  She reports that about 1 week ago she started to feels tired.  Feels like she can't get enough sleep.  Denies SI/HI.    Reports 3 day hx of nasal congestion and cough.  Denies fever at home, + fatigue.   Review of Systems See HPI  Past Medical History  Diagnosis Date  . Thyroid disease   . Asthma     exercise induced  . Heart murmur   . History of chicken pox     History   Social History  . Marital Status: Married    Spouse Name: N/A    Number of Children: N/A  . Years of Education: N/A   Occupational History  . Not on file.   Social History Main Topics  . Smoking status: Current Every Day Smoker    Types: Cigarettes  . Smokeless tobacco: Never Used     Comment: 4-5 (1 pack weekly) cigarettes daily  . Alcohol Use: Yes     Comment: occasional  . Drug Use: Not on file  . Sexually Active: Not on file   Other Topics Concern  . Not on file   Social History Narrative   Live husband 2 children and her parents   Stay at home mom   Completed high school.     Enjoys movies/dinner out etc.    Past Surgical History  Procedure Laterality Date  . Cholecystectomy  2010  . Tubal ligation  2012  . Wisdom tooth extraction  2003    Family History  Problem Relation Age of Onset  . Diabetes Mother   . Hypertension Mother   . Cancer Mother     breast  . Stroke Father   . Factor V Leiden deficiency Father   . Cancer Paternal Grandfather     melanoma, prostate cancer  . Heart disease Neg Hx     Allergies  Allergen Reactions  . Cortisone     Pt reports swelling and discoloration at previous injection sites. Decreased pulse in foot.  . Morphine     Pt reports tension in back and difficulty moving.    Current Outpatient Prescriptions on File Prior to Visit  Medication Sig  Dispense Refill  . albuterol (PROVENTIL HFA;VENTOLIN HFA) 108 (90 BASE) MCG/ACT inhaler Inhale 2 puffs into the lungs every 6 (six) hours as needed.       No current facility-administered medications on file prior to visit.    BP 102/80  Pulse 84  Temp(Src) 97.6 F (36.4 C) (Oral)  Resp 16  Wt 169 lb 0.6 oz (76.676 kg)  BMI 26.88 kg/m2  SpO2 99%  LMP 10/03/2012       Objective:   Physical Exam  Constitutional: She appears well-developed and well-nourished. No distress.  HENT:  Head: Normocephalic and atraumatic.  Mouth/Throat: No posterior oropharyngeal edema or posterior oropharyngeal erythema.  Bilateral TM's occluded by cerumen.  Cardiovascular: Normal rate and regular rhythm.   No murmur heard. Pulmonary/Chest: Effort normal and breath sounds normal. No respiratory distress. She has no wheezes. She has no rales. She exhibits no tenderness.  Musculoskeletal: She exhibits no edema.  Lymphadenopathy:    She has no cervical adenopathy.  Psychiatric: Her behavior is normal. Judgment and thought content normal.  Flat affect  Assessment & Plan:

## 2012-11-03 NOTE — Assessment & Plan Note (Signed)
Still uncontrolled.  Plan to continue effexor and add sertraline.

## 2012-11-03 NOTE — Patient Instructions (Addendum)
Start sertraline 50mg - 1/2 tab once daily for the first week, then increase to a full tab on week two.   Call if nasal congestion/cough worsens or if no improvement by 1 week.  Follow up in 1 month.

## 2012-11-04 ENCOUNTER — Telehealth: Payer: Self-pay | Admitting: Family

## 2012-11-04 NOTE — Telephone Encounter (Signed)
See my chart message

## 2012-11-12 ENCOUNTER — Ambulatory Visit: Payer: PRIVATE HEALTH INSURANCE | Admitting: Family

## 2012-12-01 ENCOUNTER — Ambulatory Visit: Payer: PRIVATE HEALTH INSURANCE | Admitting: Family

## 2012-12-08 ENCOUNTER — Other Ambulatory Visit: Payer: Self-pay | Admitting: Family

## 2012-12-08 ENCOUNTER — Other Ambulatory Visit (HOSPITAL_COMMUNITY)
Admission: RE | Admit: 2012-12-08 | Discharge: 2012-12-08 | Disposition: A | Discharge status: Home / Self Care | Payer: PRIVATE HEALTH INSURANCE | Source: Ambulatory Visit | Attending: Family | Admitting: Family

## 2012-12-08 ENCOUNTER — Encounter: Payer: Self-pay | Admitting: Family

## 2012-12-08 ENCOUNTER — Ambulatory Visit (INDEPENDENT_AMBULATORY_CARE_PROVIDER_SITE_OTHER): Payer: PRIVATE HEALTH INSURANCE | Admitting: Family

## 2012-12-08 VITALS — BP 116/80 | HR 88 | Temp 97.8°F | Resp 16 | Wt 172.1 lb

## 2012-12-08 DIAGNOSIS — Z01419 Encounter for gynecological examination (general) (routine) without abnormal findings: Secondary | ICD-10-CM

## 2012-12-08 DIAGNOSIS — Z23 Encounter for immunization: Secondary | ICD-10-CM

## 2012-12-08 DIAGNOSIS — R21 Rash and other nonspecific skin eruption: Secondary | ICD-10-CM

## 2012-12-08 MED ORDER — FLUCONAZOLE 150 MG PO TABS: ORAL_TABLET | ORAL | Status: DC

## 2012-12-08 MED ORDER — ALBUTEROL SULFATE HFA 108 (90 BASE) MCG/ACT IN AERS: 2.0000 | INHALATION_SPRAY | Freq: Four times a day (QID) | RESPIRATORY_TRACT | Status: AC | PRN

## 2012-12-08 MED ORDER — SERTRALINE HCL 50 MG PO TABS: 50.0000 mg | ORAL_TABLET | Freq: Every day | ORAL | Status: DC

## 2012-12-08 NOTE — Progress Notes (Signed)
Subjective:    Patient ID: Tracy Mejia, female    DOB: 11/24/1985, 27 y.o.   MRN: [id number]  HPI  Tracy Mejia is a 27 yr old female who presents today for routine GYN exam. She reports that her last pap smear was >2 yrs ago and was normal.  She reports normal periods.   Rectal pain/itching- Reports rectal pain.  She also reports a "rash on my butt."  She reports 2 week hx of rectal pain and itching X >1 month. She has used A and D ointment which helps briefly.  She denies similar symptoms in her family members.      Review of Systems See HPI  Past Medical History  Diagnosis Date  . Thyroid disease   . Asthma     exercise induced  . Heart murmur   . History of chicken pox     History   Social History  . Marital Status: Married    Spouse Name: N/A    Number of Children: N/A  . Years of Education: N/A   Occupational History  . Not on file.   Social History Main Topics  . Smoking status: Former Smoker    Types: Cigarettes    Quit date: 11/10/2012  . Smokeless tobacco: Never Used     Comment: 4-5 (1 pack weekly) cigarettes daily  . Alcohol Use: Yes     Comment: occasional  . Drug Use: Not on file  . Sexually Active: Not on file   Other Topics Concern  . Not on file   Social History Narrative   Live husband 2 children and her parents   Stay at home mom   Completed high school.     Enjoys movies/dinner out etc.    Past Surgical History  Procedure Laterality Date  . Cholecystectomy  2010  . Tubal ligation  2012  . Wisdom tooth extraction  2003    Family History  Problem Relation Age of Onset  . Diabetes Mother   . Hypertension Mother   . Cancer Mother     breast  . Stroke Father   . Factor V Leiden deficiency Father   . Cancer Paternal Grandfather     melanoma, prostate cancer  . Heart disease Neg Hx     Allergies  Allergen Reactions  . Cortisone     Pt reports swelling and discoloration at previous injection sites. Decreased pulse in foot.  .  Morphine     Pt reports tension in back and difficulty moving.    Current Outpatient Prescriptions on File Prior to Visit  Medication Sig Dispense Refill  . sertraline (ZOLOFT) 50 MG tablet Take 1 tablet (50 mg total) by mouth daily.  30 tablet  0  . venlafaxine (EFFEXOR) 37.5 MG tablet Take 1 tablet (37.5 mg total) by mouth 2 (two) times daily.  60 tablet  2  . albuterol (PROVENTIL HFA;VENTOLIN HFA) 108 (90 BASE) MCG/ACT inhaler Inhale 2 puffs into the lungs every 6 (six) hours as needed.      . ferrous fumarate (HEMOCYTE - 106 MG FE) 325 (106 FE) MG TABS Take 1 tablet by mouth.       No current facility-administered medications on file prior to visit.    BP 116/80  Pulse 88  Temp(Src) 97.8 F (36.6 C) (Oral)  Resp 16  Wt 172 lb 1.9 oz (78.073 kg)  BMI 27.37 kg/m2  SpO2 99%  LMP 11/24/2012       Objective:   Physical Exam  Constitutional: She appears well-developed and well-nourished. No distress.  Cardiovascular: Normal rate and regular rhythm.   No murmur heard. Pulmonary/Chest: Effort normal and breath sounds normal. No respiratory distress. She has no wheezes. She has no rales. She exhibits no tenderness.  Genitourinary:  Breasts: Examined lying and sitting.  Right: Without masses, retractions, discharge or axillary adenopathy.  Left: Without masses, retractions, discharge or axillary adenopathy.  Inguinal/mons: Normal without inguinal adenopathy  External genitalia: Normal  BUS/Urethra/Skene's glands: Normal  Bladder: Normal  Vagina: Normal  Cervix: Normal  Uterus: normal in size, shape and contour. Midline and mobile  Adnexa/parametria:  Rt: Without masses or tenderness.  Lt: Without masses or tenderness.  Anus and perineum: mild erythema noted without rash on anus, no visible external hemorrhoids. Right labia near perineum skin appears pink and slightly irregular.      Skin:  Excoriated scabbed rash noted on bilateral buttocks.          Assessment &  Plan:

## 2012-12-08 NOTE — Assessment & Plan Note (Signed)
Most likely yeast. Recommended trial of diflucan. Resolving HSV infection is another possiblity. Will check HSV titers.

## 2012-12-08 NOTE — Assessment & Plan Note (Signed)
Pt counseled on BSE. Pap performed today.  

## 2012-12-08 NOTE — Patient Instructions (Addendum)
Please call if rash worsens or if it does not improve.  Complete lab work prior to leaving.

## 2012-12-09 LAB — HSV(HERPES SIMPLEX VRS) I + II AB-IGM: Herpes Simplex Vrs I&II-IgM Ab (EIA): 1.45 INDEX — ABNORMAL HIGH

## 2012-12-09 LAB — HSV 1 ANTIBODY, IGG: HSV 1 Glycoprotein G Ab, IgG: 4.35 IV — ABNORMAL HIGH

## 2012-12-09 LAB — HSV 2 ANTIBODY, IGG: HSV 2 Glycoprotein G Ab, IgG: 0.12 IV

## 2012-12-12 ENCOUNTER — Encounter: Payer: Self-pay | Admitting: Family

## 2012-12-13 ENCOUNTER — Encounter: Payer: Self-pay | Admitting: Family

## 2012-12-15 ENCOUNTER — Encounter: Payer: Self-pay | Admitting: Family

## 2012-12-15 ENCOUNTER — Telehealth: Payer: Self-pay | Admitting: Family

## 2012-12-15 NOTE — Telephone Encounter (Signed)
Opened on accident

## 2012-12-15 NOTE — Telephone Encounter (Signed)
Reviewed MyChart message.  Attempted to reach pt on phone.  Left message letting her know that I will send MyChart message and to contact me with any further questions.

## 2012-12-24 ENCOUNTER — Encounter: Payer: Self-pay | Admitting: Family

## 2013-07-30 ENCOUNTER — Other Ambulatory Visit: Payer: Self-pay

## 2014-04-09 ENCOUNTER — Telehealth: Payer: Self-pay | Admitting: Family

## 2014-04-09 NOTE — Telephone Encounter (Signed)
Patient Information:  Caller Name: Natalia  Phone: [phone]  Patient: Tracy Mejia, Tracy Mejia  Gender: Female  DOB: [birth year]  Age: 28 Years  PCP: Debbrah Alar (Adults only)  Pregnant: No  Office Follow Up:  Does the office need to follow up with this patient?: No  Instructions For The Office: N/A   Symptoms  Reason For Call & Symptoms: Patient calling about chest pain.  She reports she has knot just below collar bone and above the breast estimated size of egg.  It is made more painful with movement and breathing.  Onset pain  estimated March 2015; the knot appeared estimated June 2015.  Pain rated at 5-6 of 10.  Patient is able to talk in complete sentences/  Emergent symptoms ruled out.  See Today in Office per Chest Pain guideline due to  All other patients with chest pain.  Reviewed Health History In EMR: Yes  Reviewed Medications In EMR: Yes  Reviewed Allergies In EMR: Yes  Reviewed Surgeries / Procedures: Yes  Date of Onset of Symptoms: Unknown  Treatments Tried: Muscle relaxers, Motrin, Tylenol; has been seen in ED and had x-rays done  Treatments Tried Worked: Yes OB / GYN:  LMP: 03/29/2014  Guideline(s) Used:  Chest Pain  Skin Lesion - Moles or Growths  Disposition Per Guideline:   See Today in Office  Reason For Disposition Reached:   All other patients with chest pain  Advice Given:  Expected Course:  These mild chest pains usually disappear within 3 days.  Call Back If:  Severe chest pain  Difficulty breathing  Fever  You become worse.  Patient Refused Recommendation:  Patient Refused Care Advice  Patient relates she is not able to see provider on 04/09/14 due to lack of funds/ copay.

## 2014-04-13 ENCOUNTER — Ambulatory Visit (INDEPENDENT_AMBULATORY_CARE_PROVIDER_SITE_OTHER): Payer: PRIVATE HEALTH INSURANCE | Admitting: Family

## 2014-04-13 ENCOUNTER — Encounter: Payer: Self-pay | Admitting: Family

## 2014-04-13 VITALS — BP 102/82 | HR 76 | Temp 98.2°F | Resp 16 | Ht 66.5 in | Wt 172.0 lb

## 2014-04-13 DIAGNOSIS — Z862 Personal history of diseases of the blood and blood-forming organs and certain disorders involving the immune mechanism: Secondary | ICD-10-CM

## 2014-04-13 DIAGNOSIS — M545 Low back pain, unspecified: Secondary | ICD-10-CM

## 2014-04-13 DIAGNOSIS — Z8639 Personal history of other endocrine, nutritional and metabolic disease: Secondary | ICD-10-CM

## 2014-04-13 DIAGNOSIS — F909 Attention-deficit hyperactivity disorder, unspecified type: Secondary | ICD-10-CM

## 2014-04-13 DIAGNOSIS — F411 Generalized anxiety disorder: Secondary | ICD-10-CM | POA: Insufficient documentation

## 2014-04-13 DIAGNOSIS — L659 Nonscarring hair loss, unspecified: Secondary | ICD-10-CM

## 2014-04-13 LAB — CBC WITH DIFFERENTIAL/PLATELET
BASOS ABS: 0 10*3/uL (ref 0.0–0.1)
BASOS PCT: 0 % (ref 0–1)
EOS ABS: 0.1 10*3/uL (ref 0.0–0.7)
Eosinophils Relative: 1 % (ref 0–5)
HCT: 37.5 % (ref 36.0–46.0)
HEMOGLOBIN: 13.2 g/dL (ref 12.0–15.0)
Lymphocytes Relative: 14 % (ref 12–46)
Lymphs Abs: 1.3 10*3/uL (ref 0.7–4.0)
MCH: 28.2 pg (ref 26.0–34.0)
MCHC: 35.2 g/dL (ref 30.0–36.0)
MCV: 80.1 fL (ref 78.0–100.0)
MONOS PCT: 7 % (ref 3–12)
Monocytes Absolute: 0.6 10*3/uL (ref 0.1–1.0)
NEUTROS PCT: 78 % — AB (ref 43–77)
Neutro Abs: 7.2 10*3/uL (ref 1.7–7.7)
PLATELETS: 273 10*3/uL (ref 150–400)
RBC: 4.68 MIL/uL (ref 3.87–5.11)
RDW: 13.7 % (ref 11.5–15.5)
WBC: 9.2 10*3/uL (ref 4.0–10.5)

## 2014-04-13 LAB — TSH: TSH: 3.799 u[IU]/mL (ref 0.350–4.500)

## 2014-04-13 MED ORDER — SERTRALINE HCL 50 MG PO TABS: ORAL_TABLET | ORAL | Status: DC

## 2014-04-13 MED ORDER — MELOXICAM 7.5 MG PO TABS: 7.5000 mg | ORAL_TABLET | Freq: Every day | ORAL | Status: DC

## 2014-04-13 NOTE — Assessment & Plan Note (Signed)
+   hair loss per pt. Will obtain TSH and CBC. She is not currently on synthroid.

## 2014-04-13 NOTE — Progress Notes (Signed)
Subjective:    Patient ID: Tracy Mejia, female    DOB: 06-20-1986, 28 y.o.   MRN: [id number]  HPI  Tracy Mejia is a 28 yr old female who presents today with multiple medical concerns.   Cyst Pt reports knot on right side of chest x 1 month but notes pain in that area since February. Reports that 1 month ago. Reports chest pain is "constant"  Dizziness Pt reports intermittent episodes of dizziness, lightheadedness and feeling like she will pass out. Has happened when she is laying down.    Back Pain Pt reports back pain x 10 years and worsening x 1-2 months. Has history of arthritis and degenerative discs.reports + pain in her lower back, worse with standing.  She has been using tylenol and ibuprofen.  Working at [employer]- about 25 hrs a week.  Denies radiation of pain. Reports that her leg fell asleep the other day but no other numbness.or weakness.    ADHD-Pt requests to go back on Adderall. She has not been treated since 2005. Reports that she was diagnosed in the 3rd grade.  Reports that she has trouble remembering/focusing at work.    +hair loss. Some anxiety. Not sleeping well.  Reports poor energy.  Review of Systems    see HPI  Past Medical History  Diagnosis Date  . Thyroid disease   . Asthma     exercise induced  . Heart murmur   . History of chicken pox     History   Social History  . Marital Status: Married    Spouse Name: N/A    Number of Children: N/A  . Years of Education: N/A   Occupational History  . Not on file.   Social History Main Topics  . Smoking status: Current Some Day Smoker    Types: Cigarettes    Last Attempt to Quit: 11/10/2012  . Smokeless tobacco: Never Used     Comment: 4-5 (1 pack weekly) cigarettes daily  . Alcohol Use: Yes     Comment: occasional  . Drug Use: Not on file  . Sexual Activity: Not on file   Other Topics Concern  . Not on file   Social History Narrative   Live husband 2 children and her parents   Stay at home mom    Completed high school.     Enjoys movies/dinner out etc.    Past Surgical History  Procedure Laterality Date  . Cholecystectomy  2010  . Tubal ligation  2012  . Wisdom tooth extraction  2003    Family History  Problem Relation Age of Onset  . Diabetes Mother   . Hypertension Mother   . Cancer Mother     breast  . Stroke Father   . Factor V Leiden deficiency Father   . Cancer Paternal Grandfather     melanoma, prostate cancer  . Heart disease Neg Hx     Allergies  Allergen Reactions  . Cortisone     INJECTION ONLY. Pt reports swelling and discoloration at previous injection sites. Decreased pulse in foot.  . Morphine     Pt reports tension in back and difficulty moving.    Current Outpatient Prescriptions on File Prior to Visit  Medication Sig Dispense Refill  . albuterol (PROVENTIL HFA;VENTOLIN HFA) 108 (90 BASE) MCG/ACT inhaler Inhale 2 puffs into the lungs every 6 (six) hours as needed.  1 Inhaler  3  . ferrous fumarate (HEMOCYTE - 106 MG FE) 325 (106 FE) MG TABS  Take 1 tablet by mouth.      . fluconazole (DIFLUCAN) 150 MG tablet One tablet by mouth today, may repeat in 3 days.  2 tablet  0  . venlafaxine (EFFEXOR) 37.5 MG tablet Take 1 tablet (37.5 mg total) by mouth 2 (two) times daily.  60 tablet  2   No current facility-administered medications on file prior to visit.    BP 102/82  Pulse 76  Temp(Src) 98.2 F (36.8 C) (Oral)  Resp 16  Ht 5' 6.5" (1.689 m)  Wt 172 lb 0.6 oz (78.037 kg)  BMI 27.36 kg/m2  SpO2 97%  LMP 03/24/2014    Objective:   Physical Exam  Constitutional: She is oriented to person, place, and time. She appears well-developed and well-nourished. No distress.  Cardiovascular: Normal rate and regular rhythm.   No murmur heard. Pulmonary/Chest: Effort normal and breath sounds normal. No respiratory distress. She has no wheezes. She has no rales. She exhibits no tenderness.  Musculoskeletal:  + right anterior chest wall tenderness to  palpation, no palpable mass or cyst noted  Neurological: She is alert and oriented to person, place, and time.  Bilateral LE strength is 5/5 + straight leg raise bilaterally  Skin:  No obvious balding/hair loss noted          Assessment & Plan:

## 2014-04-13 NOTE — Progress Notes (Signed)
Pre visit review using our clinic review tool, if applicable. No additional management support is needed unless otherwise documented below in the visit note. 

## 2014-04-13 NOTE — Patient Instructions (Addendum)
Start meloxicam one tablet by mouth once daily for chest tenderness and back pain. Start zoloft 50mg - 1/2 tab by mouth once daily for 1 week, then increase to a full tab once daily on week two.  Complete lab work prior to leaving. Follow up in 1 month.

## 2014-04-13 NOTE — Assessment & Plan Note (Addendum)
Will see how she does with zoloft and improvement of her anxiety.  If memory/concentration issues persist despite zoloft, will plan to initiate adderall.

## 2014-04-13 NOTE — Assessment & Plan Note (Signed)
Will give trial of meloxicam.  If symptoms worsen or do not improve consider PT referral.

## 2014-04-13 NOTE — Assessment & Plan Note (Signed)
Deteriorated.  Has been on zoloft and effexor in the past but discontinued due to lack of insurance >1 year ago. Resume zoloft.

## 2014-04-14 ENCOUNTER — Encounter: Payer: Self-pay | Admitting: Family

## 2014-05-03 ENCOUNTER — Ambulatory Visit: Payer: Self-pay | Admitting: Family

## 2014-05-18 ENCOUNTER — Ambulatory Visit: Payer: Self-pay | Admitting: Family

## 2014-08-31 DIAGNOSIS — M13161 Monoarthritis, not elsewhere classified, right knee: Secondary | ICD-10-CM | POA: Insufficient documentation

## 2014-10-04 DIAGNOSIS — R1314 Dysphagia, pharyngoesophageal phase: Secondary | ICD-10-CM | POA: Insufficient documentation

## 2014-12-29 DIAGNOSIS — N393 Stress incontinence (female) (male): Secondary | ICD-10-CM | POA: Insufficient documentation

## 2015-01-05 DIAGNOSIS — N814 Uterovaginal prolapse, unspecified: Secondary | ICD-10-CM | POA: Insufficient documentation

## 2015-01-24 ENCOUNTER — Encounter: Payer: Self-pay | Admitting: Family

## 2015-10-05 DIAGNOSIS — G4719 Other hypersomnia: Secondary | ICD-10-CM | POA: Insufficient documentation

## 2015-10-05 DIAGNOSIS — R42 Dizziness and giddiness: Secondary | ICD-10-CM | POA: Insufficient documentation

## 2015-10-05 DIAGNOSIS — R0683 Snoring: Secondary | ICD-10-CM | POA: Insufficient documentation

## 2015-10-05 DIAGNOSIS — S8992XA Unspecified injury of left lower leg, initial encounter: Secondary | ICD-10-CM | POA: Insufficient documentation

## 2015-10-05 DIAGNOSIS — G44229 Chronic tension-type headache, not intractable: Secondary | ICD-10-CM | POA: Insufficient documentation

## 2017-08-22 DIAGNOSIS — R748 Abnormal levels of other serum enzymes: Secondary | ICD-10-CM | POA: Insufficient documentation

## 2017-12-09 DIAGNOSIS — K580 Irritable bowel syndrome with diarrhea: Secondary | ICD-10-CM | POA: Insufficient documentation

## 2017-12-09 DIAGNOSIS — K760 Fatty (change of) liver, not elsewhere classified: Secondary | ICD-10-CM | POA: Insufficient documentation

## 2018-01-28 DIAGNOSIS — F321 Major depressive disorder, single episode, moderate: Secondary | ICD-10-CM | POA: Insufficient documentation

## 2018-05-15 ENCOUNTER — Telehealth: Payer: Self-pay | Admitting: *Deleted

## 2018-05-15 NOTE — Telephone Encounter (Signed)
"  I am calling to see what time my surgery is scheduled and I have some questions."  When are you scheduled for surgery?  "I'm scheduled for August 28."  Someone from the surgical center will call you a day or two prior to your surgery date.  They will give you your arrival time.  I attempted to call patient on her mobile and work numbers.  I could not leave a message.  I tried her home number.  I left her a message that she is scheduled for an appointment here at the [facility] on Wednesday, August 28, for a consultation, not surgery.  "I received a call that I'm scheduled for a consult and not surgery.  She said I'm a new patient."  Yes, I called you back and left the message.  You are scheduled as a new patient for a consultation.  Once we see you then we can get you scheduled for surgery.  "Oh, that dumb girl said it was for my surgery."  Who did you speak with.  "I don't know her name."

## 2018-05-21 ENCOUNTER — Other Ambulatory Visit: Payer: Self-pay

## 2018-05-21 ENCOUNTER — Ambulatory Visit (INDEPENDENT_AMBULATORY_CARE_PROVIDER_SITE_OTHER): Payer: BLUE CROSS/BLUE SHIELD | Admitting: Podiatry

## 2018-05-21 DIAGNOSIS — M722 Plantar fascial fibromatosis: Secondary | ICD-10-CM

## 2018-05-21 NOTE — Patient Instructions (Signed)
Pre-Operative Instructions  Congratulations, you have decided to take an important step towards improving your quality of life.  You can be assured that the doctors and staff at [facility] will be with you every step of the way.  Here are some important things you should know:  1. Plan to be at the surgery center/hospital at least 1 (one) hour prior to your scheduled time, unless otherwise directed by the surgical center/hospital staff.  You must have a responsible adult accompany you, remain during the surgery and drive you home.  Make sure you have directions to the surgical center/hospital to ensure you arrive on time. 2. If you are having surgery at [facility] or [facility] hospitals, you will need a copy of your medical history and physical form from your family physician within one month prior to the date of surgery. We will give you a form for your primary physician to complete.  3. We make every effort to accommodate the date you request for surgery.  However, there are times where surgery dates or times have to be moved.  We will contact you as soon as possible if a change in schedule is required.   4. No aspirin/ibuprofen for one week before surgery.  If you are on aspirin, any non-steroidal anti-inflammatory medications (Mobic, Aleve, Ibuprofen) should not be taken seven (7) days prior to your surgery.  You make take Tylenol for pain prior to surgery.  5. Medications - If you are taking daily heart and blood pressure medications, seizure, reflux, allergy, asthma, anxiety, pain or diabetes medications, make sure you notify the surgery center/hospital before the day of surgery so they can tell you which medications you should take or avoid the day of surgery. 6. No food or drink after midnight the night before surgery unless directed otherwise by surgical center/hospital staff. 7. No alcoholic beverages 24-hours prior to surgery.  No smoking 24-hours prior or 24-hours after  surgery. 8. Wear loose pants or shorts. They should be loose enough to fit over bandages, boots, and casts. 9. Don't wear slip-on shoes. Sneakers are preferred. 10. Bring your boot with you to the surgery center/hospital.  Also bring crutches or a walker if your physician has prescribed it for you.  If you do not have this equipment, it will be provided for you after surgery. 11. If you have not been contacted by the surgery center/hospital by the day before your surgery, call to confirm the date and time of your surgery. 12. Leave-time from work may vary depending on the type of surgery you have.  Appropriate arrangements should be made prior to surgery with your employer. 13. Prescriptions will be provided immediately following surgery by your doctor.  Fill these as soon as possible after surgery and take the medication as directed. Pain medications will not be refilled on weekends and must be approved by the doctor. 14. Remove nail polish on the operative foot and avoid getting pedicures prior to surgery. 15. Wash the night before surgery.  The night before surgery wash the foot and leg well with water and the antibacterial soap provided. Be sure to pay special attention to beneath the toenails and in between the toes.  Wash for at least three (3) minutes. Rinse thoroughly with water and dry well with a towel.  Perform this wash unless told not to do so by your physician.  Enclosed: 1 Ice pack (please put in freezer the night before surgery)   1 Hibiclens skin cleaner     Pre-op instructions  If you have any questions regarding the instructions, please do not hesitate to call our office.  [place]: [address], [place], [place] [address] -- [phone]  [place]: [address] , [place] [address] -- [phone]  [place]: [address]  [place], [place] [address] -- [phone]  [place]: [address], [address], [place], [place] [address] -- [phone]  Website:  [email or web address] 

## 2018-05-24 NOTE — Progress Notes (Signed)
   Subjective: 32 year old female presenting today as a new patient, referred by Dr. Gershon Mussel, with a chief complaint of left heel pain that began several months ago. She was told she has a bone spur. Walking increases the pain. Patient is here for further evaluation and treatment.   Past Medical History:  Diagnosis Date  . Asthma    exercise induced  . Heart murmur   . History of chicken pox   . Thyroid disease      Objective: Physical Exam General: The patient is alert and oriented x3 in no acute distress.  Dermatology: Skin is warm, dry and supple bilateral lower extremities. Negative for open lesions or macerations bilateral.   Vascular: Dorsalis Pedis and Posterior Tibial pulses palpable bilateral.  Capillary fill time is immediate to all digits.  Neurological: Epicritic and protective threshold intact bilateral.   Musculoskeletal: Tenderness to palpation to the plantar aspect of the left heel along the plantar fascia. All other joints range of motion within normal limits bilateral. Strength 5/5 in all groups bilateral.    Assessment: 1. Plantar fasciitis left foot  Plan of Care:  1. Patient evaluated. Xrays from Dr. Gershon Mussel reviewed.   2. Today we discussed the conservative versus surgical management of the presenting pathology. The patient opts for surgical management. All possible complications and details of the procedure were explained. All patient questions were answered. No guarantees were expressed or implied. 3. Authorization for surgery was initiated today. Surgery will consist of EPF left.  4. Return to clinic one week post op.    Edrick Kins, DPM [facility]  Dr. Edrick Kins, DPM    [address], [place] [address]                Office [phone]  Fax [phone]

## 2018-06-12 ENCOUNTER — Other Ambulatory Visit: Payer: Self-pay | Admitting: Podiatry

## 2018-06-12 ENCOUNTER — Encounter: Payer: Self-pay | Admitting: Podiatry

## 2018-06-12 DIAGNOSIS — M722 Plantar fascial fibromatosis: Secondary | ICD-10-CM | POA: Diagnosis not present

## 2018-06-12 MED ORDER — OXYCODONE-ACETAMINOPHEN 5-325 MG PO TABS: 1.0000 | ORAL_TABLET | ORAL | 0 refills | Status: DC | PRN

## 2018-06-12 MED ORDER — MELOXICAM 15 MG PO TABS: 15.0000 mg | ORAL_TABLET | Freq: Every day | ORAL | 0 refills | Status: DC

## 2018-06-12 NOTE — Progress Notes (Unsigned)
Post op meds.   Percocet 5/325mg  q4h prn pain Meloxicam 15mg  daily

## 2018-06-18 ENCOUNTER — Ambulatory Visit (INDEPENDENT_AMBULATORY_CARE_PROVIDER_SITE_OTHER): Payer: BLUE CROSS/BLUE SHIELD | Admitting: Podiatry

## 2018-06-18 ENCOUNTER — Encounter: Payer: Self-pay | Admitting: Podiatry

## 2018-06-18 DIAGNOSIS — M722 Plantar fascial fibromatosis: Secondary | ICD-10-CM

## 2018-06-18 DIAGNOSIS — Z9889 Other specified postprocedural states: Secondary | ICD-10-CM

## 2018-06-18 NOTE — Progress Notes (Signed)
   Subjective:  Patient presents today status post EPF left. DOS: 06/12/18. She states she is doing well. She reports some continued soreness and is not bearing full weight because of it. She has been wearing the CAM boot as directed without issue. Patient is here for further evaluation and treatment.    Past Medical History:  Diagnosis Date  . Asthma    exercise induced  . Heart murmur   . History of chicken pox   . Thyroid disease       Objective/Physical Exam Neurovascular status intact.  Skin incisions appear to be well coapted with sutures and staples intact. No sign of infectious process noted. No dehiscence. No active bleeding noted. Moderate edema noted to the surgical extremity.  Assessment: 1. s/p EPF left. DOS: 06/12/18   Plan of Care:  1. Patient was evaluated. 2. Dressing changed.  3. Continue weightbearing in CAM boot.  4. Return to clinic in one week.   Manufacturing systems engineer standing all day.    Edrick Kins, DPM [facility]  Dr. Edrick Kins, [address]                                        [place], [place] [address]                Office [phone]  Fax [phone]

## 2018-06-25 ENCOUNTER — Ambulatory Visit (INDEPENDENT_AMBULATORY_CARE_PROVIDER_SITE_OTHER): Payer: BLUE CROSS/BLUE SHIELD | Admitting: Podiatry

## 2018-06-25 DIAGNOSIS — M722 Plantar fascial fibromatosis: Secondary | ICD-10-CM

## 2018-06-25 DIAGNOSIS — Z9889 Other specified postprocedural states: Secondary | ICD-10-CM

## 2018-06-28 NOTE — Progress Notes (Signed)
   Subjective:  Patient presents today status post EPF left. DOS: 06/12/18. She reports some burning pain located in the medial arch and ankle. She has been using the CAM boot as directed without issue. There are no modifying factors noted. Patient is here for further evaluation and treatment.    Past Medical History:  Diagnosis Date  . Asthma    exercise induced  . Heart murmur   . History of chicken pox   . Thyroid disease       Objective/Physical Exam Neurovascular status intact.  Skin incisions appear to be well coapted with sutures and staples intact. No sign of infectious process noted. No dehiscence. No active bleeding noted. Moderate edema noted to the surgical extremity.  Assessment: 1. s/p EPF left. DOS: 06/12/18   Plan of Care:  1. Patient was evaluated. 2. Sutures removed.  3. Recommended good shoe gear.  4. Return to clinic in 4 weeks.   Manufacturing systems engineer standing all day.    Edrick Kins, DPM [facility]  Dr. Edrick Kins, [place]                                        [place], [place] [address]                Office [phone]  Fax [phone]

## 2018-07-02 NOTE — Progress Notes (Signed)
DOS: 06-12-2018 Endoscopic Plantar Fasciotomy LT  Dr. Durenda Guthrie

## 2018-07-04 ENCOUNTER — Emergency Department (HOSPITAL_COMMUNITY)
Admission: EM | Admit: 2018-07-04 | Discharge: 2018-07-05 | Disposition: A | Discharge status: Home / Self Care | Payer: PRIVATE HEALTH INSURANCE | Attending: Emergency Medicine | Admitting: Emergency Medicine

## 2018-07-04 ENCOUNTER — Other Ambulatory Visit: Payer: Self-pay

## 2018-07-04 ENCOUNTER — Telehealth: Payer: Self-pay | Admitting: Podiatry

## 2018-07-04 ENCOUNTER — Encounter (HOSPITAL_COMMUNITY): Payer: Self-pay | Admitting: Nurse Practitioner

## 2018-07-04 DIAGNOSIS — M79604 Pain in right leg: Secondary | ICD-10-CM | POA: Diagnosis not present

## 2018-07-04 DIAGNOSIS — Z79899 Other long term (current) drug therapy: Secondary | ICD-10-CM | POA: Insufficient documentation

## 2018-07-04 DIAGNOSIS — J45909 Unspecified asthma, uncomplicated: Secondary | ICD-10-CM | POA: Diagnosis not present

## 2018-07-04 DIAGNOSIS — F17210 Nicotine dependence, cigarettes, uncomplicated: Secondary | ICD-10-CM | POA: Insufficient documentation

## 2018-07-04 DIAGNOSIS — M79605 Pain in left leg: Secondary | ICD-10-CM | POA: Diagnosis not present

## 2018-07-04 LAB — BASIC METABOLIC PANEL
Anion gap: 9 (ref 5–15)
BUN: 18 mg/dL (ref 6–20)
CO2: 26 mmol/L (ref 22–32)
Calcium: 9.1 mg/dL (ref 8.9–10.3)
Chloride: 107 mmol/L (ref 98–111)
Creatinine, Ser: 0.91 mg/dL (ref 0.44–1.00)
GFR calc Af Amer: >60 mL/min (ref >60)
GLUCOSE: 119 mg/dL — AB (ref 70–99)
POTASSIUM: 3.7 mmol/L (ref 3.5–5.1)
Sodium: 142 mmol/L (ref 135–145)

## 2018-07-04 LAB — CBC
HEMATOCRIT: 36.7 % (ref 36.0–46.0)
Hemoglobin: 11.7 g/dL — ABNORMAL LOW (ref 12.0–15.0)
MCH: 27.4 pg (ref 26.0–34.0)
MCHC: 31.9 g/dL (ref 30.0–36.0)
MCV: 85.9 fL (ref 80.0–100.0)
NRBC: 0 % (ref 0.0–0.2)
Platelets: 282 10*3/uL (ref 150–400)
RBC: 4.27 MIL/uL (ref 3.87–5.11)
RDW: 12.9 % (ref 11.5–15.5)
WBC: 6.1 10*3/uL (ref 4.0–10.5)

## 2018-07-04 NOTE — Telephone Encounter (Signed)
Patient called reporting pain in both feet and legs ever since going back to work Wednesday. Had surgery 9/19 with Dr. Amalia Hailey - procedure endoscopic plantar fasciotomy. Reports pain from her knees down to both feet and legs, with the pain being worst in the front of the shin. Denies redness and swelling in the legs and feet. Denies chest pain and SOB.  Advised patient we can have Dr. Amalia Hailey evaluate on Monday. Advised to present to the ED should redness, swelling, CP, or SOB develop.   Patient states she cannot tolerate the pain until then and wishes to go to the Emergency room instead.

## 2018-07-04 NOTE — ED Provider Notes (Signed)
[facility] DEPT Provider Note   CSN: [id number] Arrival date & time: 07/04/18  2013     History   Chief Complaint Chief Complaint  Patient presents with  . Leg Pain  . Leg Swelling    HPI Tracy Mejia is a 32 y.o. female.  Patient with a complaint of bilateral leg pain kind of a burning sensation from the knees down.  And especially on the ankles.  It does involve both legs equally.  Patient 2 weeks ago had some plantar fasciitis repair done by podiatry.  Patient has no shortness of breath or chest pain.  Patient denies any swelling.  Patient currently taking Percocet following the plantar fasciitis surgery.  Patient also followed by [facility] family medicine in the [place] area.  Review of the records show that she has a history of depression.  Patient not on any blood thinners.  Patient states that the pain is constant pain from the knees below burning in sensation and also sometimes has some leg cramps.  Again these are bilateral symptoms and they started the same time.     Past Medical History:  Diagnosis Date  . Asthma    exercise induced  . Heart murmur   . History of chicken pox   . Thyroid disease     Patient Active Problem List   Diagnosis Date Noted  . Current moderate episode of major depressive disorder without prior episode ([place]) 01/28/2018  . Hepatic steatosis 12/09/2017  . Irritable bowel syndrome with diarrhea 12/09/2017  . Elevated liver enzymes 08/22/2017  . Chronic tension-type headache, not intractable 10/05/2015  . Dizziness 10/05/2015  . Excessive daytime sleepiness 10/05/2015  . Left knee injury 10/05/2015  . Snoring 10/05/2015  . Uterine prolapse 01/05/2015  . SUI (stress urinary incontinence, female) 12/29/2014  . Pharyngoesophageal dysphagia 10/04/2014  . Inflammation of joint of right knee 08/31/2014  . Anxiety state, unspecified 04/13/2014  . Rash and nonspecific skin eruption 12/08/2012  . Encounter  for routine gynecological examination 12/08/2012  . Depression 10/15/2012  . Headache(784.0) 10/15/2012  . Headache 10/15/2012  . ADJ DISORDER WITH MIXED ANXIETY & DEPRESSED MOOD 12/23/2009  . IRRITABILITY 12/23/2009  . THYROIDITIS 11/12/2008  . URINALYSIS, ABNORMAL 11/12/2008  . Hyperuricuria 11/12/2008  . SEBORRHEA 10/01/2008  . History of hypothyroidism 12/18/2007  . History of endocrine, metabolic or immunity disorder 12/18/2007  . PLANTAR WART 10/31/2007  . Low back pain 10/31/2007  . GOITER, SIMPLE 07/16/2007  . DISORDER, ADJUSTMENT W/DEPRESSED MOOD 07/16/2007  . ADHD 07/16/2007  . Attention-deficit hyperactivity disorder, unspecified type 07/16/2007  . ANEMIA-NOS 07/08/2007  . OTITIS EXTERNA, ACUTE NEC 07/08/2007  . Acute actinic otitis externa 07/08/2007  . Generalized anxiety disorder 02/12/2005  . Mild intermittent asthma 02/12/2005    Past Surgical History:  Procedure Laterality Date  . CHOLECYSTECTOMY  2010  . TUBAL LIGATION  2012  . WISDOM TOOTH EXTRACTION  2003     OB History   None      Home Medications    Prior to Admission medications   Medication Sig Start Date End Date Taking? Authorizing Provider  albuterol (PROVENTIL HFA;VENTOLIN HFA) 108 (90 BASE) MCG/ACT inhaler Inhale 2 puffs into the lungs every 6 (six) hours as needed. 12/08/12   Debbrah Alar, NP  amoxicillin (AMOXIL) 875 MG tablet  04/21/18   [provider]  aspirin-acetaminophen-caffeine (EXCEDRIN MIGRAINE) [phone] MG tablet Take by mouth.    [provider]  benzonatate (TESSALON) 100 MG capsule Take by mouth.  06/28/17   [provider]  cephALEXin (KEFLEX) 500 MG capsule Take by mouth. 12/14/17   [provider]  cyclobenzaprine (FLEXERIL) 10 MG tablet  04/02/18   [provider]  dicyclomine (BENTYL) 20 MG tablet Take by mouth. 07/31/17   [provider]  DULoxetine (CYMBALTA) 30 MG capsule Take one capsule daily for one week  then take 2 capsules daily 08/01/16   [provider]  ferrous fumarate (HEMOCYTE - 106 MG FE) 325 (106 FE) MG TABS Take 1 tablet by mouth.    [provider]  fluconazole (DIFLUCAN) 150 MG tablet One tablet by mouth today, may repeat in 3 days. 12/08/12   Debbrah Alar, NP  fluticasone (FLONASE) 50 MCG/ACT nasal spray 2 sprays by Each Nare route daily. 07/11/16   [provider]  hyoscyamine (LEVSIN SL) 0.125 MG SL tablet Place under the tongue. 12/27/16   [provider]  ibuprofen (ADVIL,MOTRIN) 800 MG tablet Take by mouth. 05/13/15   [provider]  Lidocaine 4 % PTCH Place onto the skin. 10/16/16   [provider]  meloxicam (MOBIC) 15 MG tablet Take 1 tablet (15 mg total) by mouth daily. 06/12/18   Edrick Kins, DPM  ondansetron (ZOFRAN-ODT) 4 MG disintegrating tablet  04/21/18   [provider]  oxyCODONE-acetaminophen (PERCOCET) 5-325 MG tablet Take 1 tablet by mouth every 4 (four) hours as needed for severe pain. 06/12/18   Edrick Kins, DPM  sertraline (ZOLOFT) 50 MG tablet 1/2 tab by mouth once daily for 1 week, then increase to a full tab once daily on week two. 04/13/14   Debbrah Alar, NP  Spinosad 0.9 % SUSP APP UTD 03/18/18   [provider]  topiramate (TOPAMAX) 25 MG tablet Titrate 1 tab QHS 1st wk, 2 tabs QHS 2nd wk, 3 tabs QHS 3rd wk, 4 tabs QHS 4th wk 12/27/16   [provider]  traZODone (DESYREL) 50 MG tablet Take 1/2 tablet at bedtime for one week then take one tablet at bedtime 08/01/16   [provider]  venlafaxine (EFFEXOR) 37.5 MG tablet Take 1 tablet (37.5 mg total) by mouth 2 (two) times daily. 11/03/12   Debbrah Alar, NP    Family History Family History  Problem Relation Age of Onset  . Cancer Paternal Grandfather        melanoma, prostate cancer  . Diabetes Mother   . Hypertension Mother   . Cancer Mother        breast  . Stroke Father   . Factor V Leiden  deficiency Father   . Heart disease Neg Hx     Social History Social History   Tobacco Use  . Smoking status: Current Some Day Smoker    Types: Cigarettes    Last attempt to quit: 11/10/2012    Years since quitting: 5.6  . Smokeless tobacco: Never Used  . Tobacco comment: 4-5 (1 pack weekly) cigarettes daily  Substance Use Topics  . Alcohol use: Yes    Comment: occasional  . Drug use: Not on file     Allergies   Cortisone; Morphine; and Liraglutide   Review of Systems Review of Systems  Constitutional: Negative for fever.  HENT: Negative for congestion.   Eyes: Negative for redness.  Respiratory: Negative for shortness of breath.   Cardiovascular: Negative for chest pain, palpitations and leg swelling.  Gastrointestinal: Negative for abdominal pain.  Genitourinary: Negative for dysuria.  Skin: Negative for rash.  Neurological: Negative for syncope,  weakness, numbness and headaches.  Hematological: Does not bruise/bleed easily.  Psychiatric/Behavioral: Negative for confusion.     Physical Exam Updated Vital Signs BP 109/76 (BP Location: Right Arm)   Pulse 81   Temp 98.2 F (36.8 C) (Oral)   Resp 16   SpO2 98%   Physical Exam  Constitutional: She is oriented to person, place, and time. She appears well-developed and well-nourished. No distress.  HENT:  Head: Normocephalic and atraumatic.  Mouth/Throat: Oropharynx is clear and moist.  Eyes: Pupils are equal, round, and reactive to light. Conjunctivae and EOM are normal.  Neck: Neck supple.  Cardiovascular: Normal rate, regular rhythm and normal heart sounds.  Pulmonary/Chest: Effort normal and breath sounds normal.  Abdominal: Soft. Bowel sounds are normal.  Musculoskeletal: Normal range of motion. She exhibits no edema.  Lateral lower extremities no erythema no swelling at the knees.  No rash.  No edema at the ankles.  Dorsalis pedis pulses 2+ bilaterally.  Excellent capillary refill less than 1 second.  No  evidence of infection at the surgical sites to the left heel ankle area.  Some mild bilateral tenderness to palpation to both calfs no cord.  Neurological: She is alert and oriented to person, place, and time. No cranial nerve deficit. Coordination normal.  Skin: Skin is warm.  Nursing note and vitals reviewed.    ED Treatments / Results  Labs (all labs ordered are listed, but only abnormal results are displayed) Labs Reviewed  CBC - Abnormal; Notable for the following components:      Result Value   Hemoglobin 11.7 (*)    All other components within normal limits  BASIC METABOLIC PANEL - Abnormal; Notable for the following components:   Glucose, Bld 119 (*)    All other components within normal limits    EKG None  Radiology No results found.  Procedures Procedures (including critical care time)  Medications Ordered in ED Medications - No data to display   Initial Impression / Assessment and Plan / ED Course  I have reviewed the triage vital signs and the nursing notes.  Pertinent labs & imaging results that were available during my care of the patient were reviewed by me and considered in my medical decision making (see chart for details).     Clinically patient symptoms not consistent with DVTs.  Would be highly unlikely to get bilateral simultaneous deep vein thrombosis but she has a lot of burning sensation.  Has no swelling.  Was concerned because of the cramps whether there was electrolyte abnormality.  Labs are normal.  Vital signs no fever no hypoxia.  No tachycardia.  No chest pain no shortness of breath.  Exact cause of the patient's symptoms are not clear it is interesting that it is bilateral and very equal in nature.  We will have patient give referral to neurology follow back up with her family medicine doctor in [place] and also follow back up with her podiatrist.  Patient has Percocet at home to take for pain.  Final Clinical Impressions(s) / ED Diagnoses    Final diagnoses:  Pain in both lower extremities    ED Discharge Orders    None       Fredia Sorrow, MD 07/05/18 0000

## 2018-07-04 NOTE — ED Triage Notes (Signed)
Pt is c/o bilateral leg pain (burning sensation and calf pain) and swelling especially on the ankles, pt and chart review indicate pt had plantar fascitis repair procedure done about 2 weeks ago. Denies hx of DVTs, PE and blood abnormalities.

## 2018-07-04 NOTE — Discharge Instructions (Addendum)
Exact cause of the bilateral leg pain from the knee below his not evident.  Labs are normal no evidence of any electrolyte abnormalities.  Recommend to make an appointment to follow-up with neurology.  Also follow back up with your primary care doctor in [place].  And follow-up with the podiatrist that did the left foot surgery.  Continue your current pain medications.  Return for any new or worse symptoms.

## 2018-07-05 MED ORDER — HYDROCODONE-ACETAMINOPHEN 5-325 MG PO TABS
1.0000 | ORAL_TABLET | Freq: Once | ORAL | Status: AC
  Administered 2018-07-05: 1 via ORAL
  Filled 2018-07-05: qty 1

## 2018-07-23 ENCOUNTER — Ambulatory Visit (INDEPENDENT_AMBULATORY_CARE_PROVIDER_SITE_OTHER): Payer: No Typology Code available for payment source

## 2018-07-23 ENCOUNTER — Encounter: Payer: Self-pay | Admitting: Podiatry

## 2018-07-23 ENCOUNTER — Ambulatory Visit (INDEPENDENT_AMBULATORY_CARE_PROVIDER_SITE_OTHER): Payer: No Typology Code available for payment source | Admitting: Podiatry

## 2018-07-23 DIAGNOSIS — Z9889 Other specified postprocedural states: Secondary | ICD-10-CM

## 2018-07-23 DIAGNOSIS — M79672 Pain in left foot: Secondary | ICD-10-CM

## 2018-07-23 DIAGNOSIS — M722 Plantar fascial fibromatosis: Secondary | ICD-10-CM | POA: Diagnosis not present

## 2018-07-23 MED ORDER — TRAMADOL HCL 50 MG PO TABS: 50.0000 mg | ORAL_TABLET | Freq: Three times a day (TID) | ORAL | 0 refills | Status: DC | PRN

## 2018-07-23 MED ORDER — COLCHICINE 0.6 MG PO TABS: 0.6000 mg | ORAL_TABLET | Freq: Every day | ORAL | 0 refills | Status: DC

## 2018-07-23 MED ORDER — METHYLPREDNISOLONE 4 MG PO TBPK: ORAL_TABLET | ORAL | 0 refills | Status: DC

## 2018-07-23 NOTE — Patient Instructions (Signed)
Take all 6 prednisone pills first day, then taper as directed.   After prednisone pack, take 2 cholchicine pills first day, then once daily x 2 weeks.

## 2018-07-23 NOTE — Progress Notes (Signed)
   HPI: 32 year old female presents the office today status post EPF left foot. DOS: 06/11/2018.  Patient states that she is in chronic severe pain that is increased significantly.  She says that her pain is getting worse every day.  Pain 1010.  She has sharp pains across the top of the foot and around the bottom of the heel.  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  Patient is very distraught due to the significant amount of pain in her foot.  Past Medical History:  Diagnosis Date  . Asthma    exercise induced  . Heart murmur   . History of chicken pox   . Thyroid disease      Physical Exam: General: The patient is alert and oriented x3 in no acute distress.  Dermatology: Skin is warm, dry and supple bilateral lower extremities. Negative for open lesions or macerations.  Vascular: Palpable pedal pulses bilaterally. No edema or erythema noted. Capillary refill within normal limits.  Neurological: Epicritic and protective threshold grossly intact bilaterally.   Musculoskeletal Exam: Range of motion within normal limits to all pedal and ankle joints bilateral. Muscle strength 5/5 in all groups bilateral.  Significant amount of pain on palpation throughout multiple areas of the foot including the ankle heel lateral column and dorsal aspect of the foot.   Assessment: 1.  Status post EPF left. DOS: 06/12/2018 2.  Increased pain diffusely throughout the left foot.   Plan of Care:  1. Patient evaluated.  2.  Resume weightbearing in the immobilization cam boot.  New cam boot was dispensed today per patient's request. 3.  Prescription for Medrol Dosepak. 4.  Prescription for colchicine 0.6 mg #14.  Begin after completing the Dosepak. 5.  Prescription for tramadol 50 mg 6.  Return to clinic in 3 weeks  Fabric inspector standing all day      Edrick Kins, DPM [facility]  Dr. Edrick Kins, DPM    [address], [place] [address]                Office [phone]  Fax [phone]

## 2018-08-13 ENCOUNTER — Ambulatory Visit: Payer: Self-pay | Admitting: Podiatry

## 2018-09-08 ENCOUNTER — Ambulatory Visit (INDEPENDENT_AMBULATORY_CARE_PROVIDER_SITE_OTHER): Payer: No Typology Code available for payment source | Admitting: Podiatry

## 2018-09-08 ENCOUNTER — Encounter: Payer: Self-pay | Admitting: Podiatry

## 2018-09-08 DIAGNOSIS — M7752 Other enthesopathy of left foot: Secondary | ICD-10-CM

## 2018-09-08 MED ORDER — MELOXICAM 15 MG PO TABS: 15.0000 mg | ORAL_TABLET | Freq: Every day | ORAL | 0 refills | Status: DC

## 2018-09-08 MED ORDER — TRAMADOL HCL 50 MG PO TABS: 50.0000 mg | ORAL_TABLET | Freq: Three times a day (TID) | ORAL | 0 refills | Status: DC | PRN

## 2018-09-11 NOTE — Progress Notes (Signed)
   HPI: 32 year old female presents the office today status post EPF left foot. DOS: 06/11/2018. She reports continued pain to the dorsum of the foot. She reports pain with flexion of the toes. She states it is difficult to put her shoes on and take them off and she cannot stand while barefoot secondary to pain. She has been taking Tramadol for pain. Patient is here for further evaluation and treatment.   Past Medical History:  Diagnosis Date  . Asthma    exercise induced  . Heart murmur   . History of chicken pox   . Thyroid disease      Physical Exam: General: The patient is alert and oriented x3 in no acute distress.  Dermatology: Skin is warm, dry and supple bilateral lower extremities. Negative for open lesions or macerations.  Vascular: Palpable pedal pulses bilaterally. No edema or erythema noted. Capillary refill within normal limits.  Neurological: Epicritic and protective threshold grossly intact bilaterally.   Musculoskeletal Exam: Range of motion within normal limits to all pedal and ankle joints bilateral. Muscle strength 5/5 in all groups bilateral. Pain with palpation noted to the extensor tendons of the left foot.   Assessment: 1. Status post EPF left. DOS: 06/12/2018 2. Extensor tendinitis left    Plan of Care:  1. Patient evaluated.  2. Return to using the CAM boot. Weightbearing x 4 weeks.  3. Prescription for Meloxicam provided to patient. 4. Refill prescription for Tramadol provided to patient.  5. Compression anklet dispensed.  6. Return to clinic in 4 weeks.   Manufacturing systems engineer standing all day      Edrick Kins, DPM [facility]  Dr. Edrick Kins, DPM    [address], [place] [address]                Office [phone]  Fax [phone]

## 2018-10-06 ENCOUNTER — Ambulatory Visit (INDEPENDENT_AMBULATORY_CARE_PROVIDER_SITE_OTHER): Payer: Medicaid Other | Admitting: Podiatry

## 2018-10-06 ENCOUNTER — Telehealth: Payer: Self-pay | Admitting: *Deleted

## 2018-10-06 DIAGNOSIS — T148XXA Other injury of unspecified body region, initial encounter: Secondary | ICD-10-CM

## 2018-10-06 DIAGNOSIS — M722 Plantar fascial fibromatosis: Secondary | ICD-10-CM

## 2018-10-06 DIAGNOSIS — M7752 Other enthesopathy of left foot: Secondary | ICD-10-CM

## 2018-10-06 DIAGNOSIS — Z9889 Other specified postprocedural states: Secondary | ICD-10-CM | POA: Diagnosis not present

## 2018-10-06 MED ORDER — MELOXICAM 15 MG PO TABS: 15.0000 mg | ORAL_TABLET | Freq: Every day | ORAL | 0 refills | Status: DC

## 2018-10-06 NOTE — Telephone Encounter (Signed)
-----   Message from Edrick Kins, DPM sent at 10/06/2018 10:21 AM EST ----- Regarding: MRI left ankle without contrast Please order MRI left ankle without contrast.  Diagnosis: Severe ankle pain x3 months.  Possible ATFL tear.  Extensor tendinitis left  Thanks, Dr. Amalia Hailey  Note dictated :-)

## 2018-10-06 NOTE — Telephone Encounter (Signed)
Orders to J. Quintana, RN for pre-cert, faxed to [facility]. 

## 2018-10-06 NOTE — Progress Notes (Signed)
   HPI: Patient presents today status post EPF left foot. DOS: 06/11/2018.  Patient states that she no longer has any heel pain and surgery alleviated her chronic plantar fasciitis.  Ever since surgery she has been complaining of dorsal foot pain and ankle pain.  Patient states that 1 week postoperatively she heard an audible pop.  We have been treating her conservatively but there is been no improvement of symptoms for the patient.  She presents today wearing an immobilization cam boot.  She is currently not working.  Past Medical History:  Diagnosis Date  . Asthma    exercise induced  . Heart murmur   . History of chicken pox   . Thyroid disease      Physical Exam: General: The patient is alert and oriented x3 in no acute distress.  Dermatology: Skin is warm, dry and supple bilateral lower extremities. Negative for open lesions or macerations.  Vascular: Palpable pedal pulses bilaterally. No edema or erythema noted. Capillary refill within normal limits.  Neurological: Epicritic and protective threshold grossly intact bilaterally.   Musculoskeletal Exam: Range of motion within normal limits to all pedal and ankle joints bilateral. Muscle strength 5/5 in all groups bilateral.  There is a significant amount of pain on palpation and also range of motion to the ankle joint.  Pain on palpation to the anterior talofibular ligament.  Pain on palpation also along the extensor tendons.  Assessment: 1.  Status post EPF left.  Date of surgery 06/12/2018.  Resolved 2.  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  Extensor tendinitis left   Plan of Care:  1. Patient evaluated.  2.  Today we are going to order an MRI left ankle.  Patient has failed multiple conservative modalities including immobilization in a cam boot.  Anti-inflammatories.  Patient cannot receive anti-inflammatory steroidal injections due to allergy.  Patient has adverse reactions in the past with injections. 3.   Refill prescription for meloxicam.  Take daily. 4.  Continue tramadol as needed pain 5.  Continue weightbearing in the immobilization cam boot as tolerated 6.  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      Edrick Kins, DPM [facility]  Dr. Edrick Kins, DPM    [address], [place] [address]                Office [phone]  Fax [phone]

## 2018-10-14 ENCOUNTER — Telehealth: Payer: Self-pay | Admitting: *Deleted

## 2018-10-14 NOTE — Telephone Encounter (Signed)
I called pt and told her the fax from [facility] stated she needed 6 weeks of doctor supervised treatment, and she had only achieved 5 weeks. I told pt she should schedule for reevaluation next week and then we would process for pre-cert for the MRI if necessary. Pt states understanding.

## 2018-10-14 NOTE — Telephone Encounter (Signed)
Pt called, states got her Medicaid end of 2019 and just called to give the number so she can get her MRI. Medicaid ID:  [id number] M. Pt is schedule with [facility] for MRI tomorrow, and I told her she needed to reschedule tomorrow's MRI appt. Pt states [facility] said if our office could send in the requested medication she may be able to get the MRI tomorrow.[facility] started pre-cert and Case: [id number]. Received fax from [place] [facility] 10/13/2018 10:21am.

## 2018-10-14 NOTE — Telephone Encounter (Signed)
I spoke with [place] she states she started the pre-cert for the MRI with [facility], and faxed the outcome which states pt needs 6 weeks of physician directed conservative treatment. I reviewed the clinicals and pt began 6 weeks of conservative treatment 09/08/2018, pt has only met 5 weeks of conservative therapy.

## 2018-10-15 ENCOUNTER — Other Ambulatory Visit: Payer: No Typology Code available for payment source

## 2018-10-22 ENCOUNTER — Encounter: Payer: Self-pay | Admitting: Podiatry

## 2018-10-22 ENCOUNTER — Ambulatory Visit (INDEPENDENT_AMBULATORY_CARE_PROVIDER_SITE_OTHER): Payer: Medicaid Other | Admitting: Podiatry

## 2018-10-22 DIAGNOSIS — M7752 Other enthesopathy of left foot: Secondary | ICD-10-CM

## 2018-10-22 DIAGNOSIS — M659 Synovitis and tenosynovitis, unspecified: Secondary | ICD-10-CM

## 2018-10-22 DIAGNOSIS — T148XXA Other injury of unspecified body region, initial encounter: Secondary | ICD-10-CM

## 2018-11-02 NOTE — Progress Notes (Signed)
   HPI: Patient presents today status post EPF left foot. DOS: 06/11/2018.  Patient states that she no longer has any heel pain and surgery alleviated her chronic plantar fasciitis.  Patient continues to complain of significant amount of pain to the left ankle.  Pain directly associated to the ATFL concerning for possible ligament compromise.  There is also significant amount of pain on palpation along the extensor tendons.  We have been treating it conservatively and there is been no improvement of symptoms with the patient.  Conservative treatments have included oral anti-inflammatories, oral medication and painkillers, as well as immobilization in a cam boot.  Last visit on 10/06/2018 an MRI was ordered however she presents today because she requires another referral for an MRI after today's visit.  She presents today so we can place that order for the MRI so it will be authorized through insurance.  There is been no change of symptoms for the patient since last visit on 10/06/2018.  Past Medical History:  Diagnosis Date  . Asthma    exercise induced  . Heart murmur   . History of chicken pox   . Thyroid disease      Physical Exam: General: The patient is alert and oriented x3 in no acute distress.  Dermatology: Skin is warm, dry and supple bilateral lower extremities. Negative for open lesions or macerations.  Vascular: Palpable pedal pulses bilaterally. No edema or erythema noted. Capillary refill within normal limits.  Neurological: Epicritic and protective threshold grossly intact bilaterally.   Musculoskeletal Exam: Range of motion within normal limits to all pedal and ankle joints bilateral. Muscle strength 5/5 in all groups bilateral.  There is a significant amount of pain on palpation and also range of motion to the ankle joint.  Pain on palpation to the anterior talofibular ligament.  Pain on palpation also along the extensor tendons.  Positive anterior drawer sign also noted with some  mild subluxation of the ankle joint.  Assessment: 1.  Status post EPF left.  Date of surgery 06/12/2018.  Resolved 2.  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  Extensor tendinitis left   Plan of Care:  1. Patient evaluated.  2.  Today we are going to re-order an MRI left ankle.  Patient has failed multiple conservative modalities including immobilization in a cam boot.  Anti-inflammatories.  Patient cannot receive anti-inflammatory steroidal injections due to allergy.  Patient has adverse reactions in the past with injections. 3.  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      Edrick Kins, DPM [facility]  Dr. Edrick Kins, DPM    [address], [place] [address]                Office [phone]  Fax [phone]

## 2018-11-04 ENCOUNTER — Telehealth: Payer: Self-pay | Admitting: *Deleted

## 2018-11-04 DIAGNOSIS — M659 Synovitis and tenosynovitis, unspecified: Secondary | ICD-10-CM

## 2018-11-04 DIAGNOSIS — M7752 Other enthesopathy of left foot: Secondary | ICD-10-CM

## 2018-11-04 DIAGNOSIS — T148XXA Other injury of unspecified body region, initial encounter: Secondary | ICD-10-CM

## 2018-11-05 NOTE — Telephone Encounter (Signed)
[employer] - Medicaid requires clinicals for review of new Case:  [id number]. Faxed clinicals and demographics to [facility].

## 2018-11-06 NOTE — Telephone Encounter (Signed)
[place] LEFT, AUTHORIZATION: [id number], EFFECTIVE DATE:  11/05/2018, END DATE: 12/05/2018. Faxed order to [facility].

## 2018-11-06 NOTE — Telephone Encounter (Signed)
I informed pt of the approval for the MRI and she could contact the [facility] to schedule [phone].

## 2018-11-12 ENCOUNTER — Ambulatory Visit: Payer: Medicaid Other | Admitting: Podiatry

## 2018-11-13 ENCOUNTER — Ambulatory Visit
Admission: RE | Admit: 2018-11-13 | Discharge: 2018-11-13 | Disposition: A | Discharge status: Home / Self Care | Payer: Medicaid Other | Source: Ambulatory Visit | Attending: Podiatry | Admitting: Podiatry

## 2018-11-17 ENCOUNTER — Ambulatory Visit (INDEPENDENT_AMBULATORY_CARE_PROVIDER_SITE_OTHER): Payer: Medicaid Other | Admitting: Podiatry

## 2018-11-17 DIAGNOSIS — G5792 Unspecified mononeuropathy of left lower limb: Secondary | ICD-10-CM

## 2018-11-17 DIAGNOSIS — M7752 Other enthesopathy of left foot: Secondary | ICD-10-CM | POA: Diagnosis not present

## 2018-11-17 MED ORDER — CYCLOBENZAPRINE HCL 5 MG PO TABS: 5.0000 mg | ORAL_TABLET | Freq: Every day | ORAL | 1 refills | Status: DC

## 2018-11-17 MED ORDER — GABAPENTIN 100 MG PO CAPS: 100.0000 mg | ORAL_CAPSULE | Freq: Three times a day (TID) | ORAL | 1 refills | Status: DC

## 2018-11-17 NOTE — Progress Notes (Signed)
   HPI: 33 year old female presents the office today for follow-up treatment evaluation regarding left foot and ankle pain.  Last visit on 10/22/2018 MRI was ordered.  She presents today to review the MRI results and discuss further treatment options.  Pain is unchanged since last visit.  Past Medical History:  Diagnosis Date  . Asthma    exercise induced  . Heart murmur   . History of chicken pox   . Thyroid disease      Physical Exam: General: The patient is alert and oriented x3 in no acute distress.  Dermatology: Skin is warm, dry and supple bilateral lower extremities. Negative for open lesions or macerations.  Vascular: Palpable pedal pulses bilaterally. No edema or erythema noted. Capillary refill within normal limits.  Neurological: Epicritic and protective threshold grossly intact bilaterally.  Today there does appear to be a positive Tinel sign with percussion of the anterior compartment of the ankle joint consistent with possible anterior compartment syndrome.  Musculoskeletal Exam: Range of motion within normal limits to all pedal and ankle joints bilateral. Muscle strength 5/5 in all groups bilateral.  There is a significant amount of pain on palpation and also range of motion to the ankle joint.  Pain on palpation to the anterior talofibular ligament.  Pain on palpation also along the extensor tendons.  Positive anterior drawer sign also noted with some mild subluxation of the ankle joint.  MRI Impression 11/13/2018: No finding to explain the patient's lateral ankle pain. Negative for ligament tear.  Status post plantar fascia release without evidence of complication.  Accessory ossicle of the navicular with marrow edema about the synchondrosis could be a source of medial ankle pain.  Assessment: 1.  Status post EPF left.  Date of surgery 06/12/2018.  Resolved 2.  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  Extensor tendinitis left 4.  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 Patient evaluated.  MRI results were reviewed today. 2.  Prescription for gabapentin 100 mg 3 times daily 3.  Prescription for Flexeril 5 mg nightly 4.  Recommend good supportive shoe gear.  Patient can discontinue the immobilization cam boot since it did not help to alleviate symptoms 5.  If the patient does not get any alleviation of symptoms with the gabapentin and Flexeril I will likely send her to physical therapy 6.  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      Edrick Kins, DPM [facility]  Dr. Edrick Kins, DPM    [address] [place], [place] [address]                Office [phone]  Fax [phone]

## 2018-11-21 ENCOUNTER — Telehealth: Payer: Self-pay | Admitting: Podiatry

## 2018-11-21 MED ORDER — CYCLOBENZAPRINE HCL 5 MG PO TABS: 5.0000 mg | ORAL_TABLET | Freq: Every day | ORAL | 1 refills | Status: DC

## 2018-11-21 MED ORDER — GABAPENTIN 100 MG PO CAPS: 100.0000 mg | ORAL_CAPSULE | Freq: Three times a day (TID) | ORAL | 1 refills | Status: AC

## 2018-11-21 NOTE — Telephone Encounter (Signed)
Pt called and was seen on 2.24.20 and was to have 2 medications sent to the pharmacy and her pharmacy never got them. She uses [facility] in [place] @ [address]. Can we please get them changed from [place] and let pt know when it was done.

## 2018-11-21 NOTE — Telephone Encounter (Signed)
I informed pt of pharmacy change and resent the flexeril and gabapentin to [place].

## 2018-12-15 ENCOUNTER — Ambulatory Visit: Payer: Medicaid Other | Admitting: Podiatry

## 2018-12-17 ENCOUNTER — Encounter: Payer: Self-pay | Admitting: Podiatry

## 2018-12-17 ENCOUNTER — Other Ambulatory Visit: Payer: Self-pay

## 2018-12-17 ENCOUNTER — Ambulatory Visit: Payer: Medicaid Other | Admitting: Podiatry

## 2018-12-17 DIAGNOSIS — M659 Synovitis and tenosynovitis, unspecified: Secondary | ICD-10-CM | POA: Diagnosis not present

## 2018-12-17 DIAGNOSIS — G5792 Unspecified mononeuropathy of left lower limb: Secondary | ICD-10-CM | POA: Diagnosis not present

## 2018-12-17 DIAGNOSIS — M7752 Other enthesopathy of left foot: Secondary | ICD-10-CM

## 2018-12-17 NOTE — Progress Notes (Signed)
   HPI: 33 year old female presents the office today for follow-up treatment evaluation regarding left foot and ankle pain.  Patient states that her pain has improved significantly.  She is able to ambulate with minimal discomfort.  She has been wearing good supportive shoe gear.  Patient states that the gabapentin is helping significantly with her symptoms.  No new complaints at this time.  Past Medical History:  Diagnosis Date  . Asthma    exercise induced  . Heart murmur   . History of chicken pox   . Thyroid disease      Physical Exam: General: The patient is alert and oriented x3 in no acute distress.  Dermatology: Skin is warm, dry and supple bilateral lower extremities. Negative for open lesions or macerations.  Vascular: Palpable pedal pulses bilaterally. No edema or erythema noted. Capillary refill within normal limits.  Neurological: Epicritic and protective threshold grossly intact bilaterally.  Percussion of the anterior compartment of the ankle does not elicit any sharp shooting Tinel sign  Musculoskeletal Exam: Range of motion within normal limits to all pedal and ankle joints bilateral. Muscle strength 5/5 in all groups bilateral.  Significantly improved pain on palpation and also range of motion to the ankle joint.  Negative for pain on palpation to the anterior talofibular ligament.  Negative for pain on palpation also along the extensor tendons.    MRI Impression 11/13/2018: No finding to explain the patient's lateral ankle pain. Negative for ligament tear.  Status post plantar fascia release without evidence of complication.  Accessory ossicle of the navicular with marrow edema about the synchondrosis could be a source of medial ankle pain.  Assessment: 1.  Status post EPF left.  Date of surgery 06/12/2018.  Resolved 2.  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  Extensor tendinitis left-resolved 4.  Neuritis left ankle-resolved    Plan of Care:  1. Patient evaluated.   2.  Continue gabapentin 100 mg 3 times daily 3.  Patient's PCP recently prescribed Flexeril 5 mg for possible TMJ.  This will likely also help alleviate symptoms to the foot and ankle 4.  Continue wearing good supportive shoe gear 5.  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      Edrick Kins, DPM [facility]  Dr. Edrick Kins, DPM    [address], [place] [address]                Office [phone]  Fax [phone]

## 2019-01-05 ENCOUNTER — Ambulatory Visit: Payer: No Typology Code available for payment source | Admitting: Podiatry

## 2020-08-11 DIAGNOSIS — K760 Fatty (change of) liver, not elsewhere classified: Secondary | ICD-10-CM | POA: Diagnosis not present

## 2020-08-11 DIAGNOSIS — R42 Dizziness and giddiness: Secondary | ICD-10-CM | POA: Diagnosis not present

## 2020-08-11 DIAGNOSIS — Z8379 Family history of other diseases of the digestive system: Secondary | ICD-10-CM | POA: Diagnosis not present

## 2020-08-25 DIAGNOSIS — R42 Dizziness and giddiness: Secondary | ICD-10-CM | POA: Diagnosis not present

## 2020-08-25 DIAGNOSIS — R350 Frequency of micturition: Secondary | ICD-10-CM | POA: Diagnosis not present

## 2020-08-25 DIAGNOSIS — R29898 Other symptoms and signs involving the musculoskeletal system: Secondary | ICD-10-CM | POA: Diagnosis not present

## 2020-08-25 DIAGNOSIS — J0140 Acute pansinusitis, unspecified: Secondary | ICD-10-CM | POA: Diagnosis not present

## 2020-08-25 DIAGNOSIS — G8929 Other chronic pain: Secondary | ICD-10-CM | POA: Diagnosis not present

## 2020-08-25 DIAGNOSIS — R519 Headache, unspecified: Secondary | ICD-10-CM | POA: Diagnosis not present

## 2020-09-01 DIAGNOSIS — Z0000 Encounter for general adult medical examination without abnormal findings: Secondary | ICD-10-CM | POA: Diagnosis not present

## 2020-09-01 DIAGNOSIS — R42 Dizziness and giddiness: Secondary | ICD-10-CM | POA: Diagnosis not present

## 2020-11-02 DIAGNOSIS — M545 Low back pain, unspecified: Secondary | ICD-10-CM | POA: Diagnosis not present

## 2020-11-02 DIAGNOSIS — M4726 Other spondylosis with radiculopathy, lumbar region: Secondary | ICD-10-CM | POA: Diagnosis not present

## 2020-11-03 ENCOUNTER — Other Ambulatory Visit (HOSPITAL_COMMUNITY): Payer: Self-pay | Admitting: Physician Assistant

## 2020-11-03 DIAGNOSIS — R197 Diarrhea, unspecified: Secondary | ICD-10-CM | POA: Diagnosis not present

## 2020-11-03 DIAGNOSIS — Z8379 Family history of other diseases of the digestive system: Secondary | ICD-10-CM | POA: Diagnosis not present

## 2020-11-03 DIAGNOSIS — K760 Fatty (change of) liver, not elsewhere classified: Secondary | ICD-10-CM | POA: Diagnosis not present

## 2020-11-03 DIAGNOSIS — R1084 Generalized abdominal pain: Secondary | ICD-10-CM | POA: Diagnosis not present

## 2020-11-03 MED FILL — DICYCLOMINE 10 MG CAPSULE: 10 | 30 days supply | Qty: 120 | Fill #0

## 2020-11-07 ENCOUNTER — Other Ambulatory Visit: Payer: Self-pay

## 2020-11-07 ENCOUNTER — Encounter (HOSPITAL_COMMUNITY): Payer: Self-pay | Admitting: Emergency Medicine

## 2020-11-07 ENCOUNTER — Ambulatory Visit (HOSPITAL_COMMUNITY)
Admission: EM | Admit: 2020-11-07 | Discharge: 2020-11-07 | Disposition: A | Discharge status: Home / Self Care | Payer: 59 | Attending: Internal Medicine | Admitting: Internal Medicine

## 2020-11-07 ENCOUNTER — Observation Stay (HOSPITAL_COMMUNITY)
Admission: EM | Admit: 2020-11-07 | Discharge: 2020-11-09 | Disposition: A | Discharge status: Home / Self Care | Payer: 59 | Attending: General Surgery | Admitting: General Surgery

## 2020-11-07 DIAGNOSIS — F17210 Nicotine dependence, cigarettes, uncomplicated: Secondary | ICD-10-CM | POA: Diagnosis not present

## 2020-11-07 DIAGNOSIS — Z20822 Contact with and (suspected) exposure to covid-19: Secondary | ICD-10-CM | POA: Insufficient documentation

## 2020-11-07 DIAGNOSIS — R1031 Right lower quadrant pain: Secondary | ICD-10-CM | POA: Diagnosis not present

## 2020-11-07 DIAGNOSIS — R109 Unspecified abdominal pain: Secondary | ICD-10-CM | POA: Diagnosis not present

## 2020-11-07 DIAGNOSIS — E039 Hypothyroidism, unspecified: Secondary | ICD-10-CM | POA: Diagnosis not present

## 2020-11-07 DIAGNOSIS — R9431 Abnormal electrocardiogram [ECG] [EKG]: Secondary | ICD-10-CM | POA: Diagnosis not present

## 2020-11-07 DIAGNOSIS — Z79899 Other long term (current) drug therapy: Secondary | ICD-10-CM | POA: Insufficient documentation

## 2020-11-07 DIAGNOSIS — K3580 Unspecified acute appendicitis: Secondary | ICD-10-CM | POA: Insufficient documentation

## 2020-11-07 DIAGNOSIS — J45909 Unspecified asthma, uncomplicated: Secondary | ICD-10-CM | POA: Insufficient documentation

## 2020-11-07 LAB — CBC
HCT: 38.7 % (ref 36.0–46.0)
Hemoglobin: 12.6 g/dL (ref 12.0–15.0)
MCH: 27.7 pg (ref 26.0–34.0)
MCHC: 32.6 g/dL (ref 30.0–36.0)
MCV: 85.1 fL (ref 80.0–100.0)
Platelets: 282 10*3/uL (ref 150–400)
RBC: 4.55 MIL/uL (ref 3.87–5.11)
RDW: 13.2 % (ref 11.5–15.5)
WBC: 7.3 10*3/uL (ref 4.0–10.5)
nRBC: 0 % (ref 0.0–0.2)

## 2020-11-07 LAB — COMPREHENSIVE METABOLIC PANEL WITH GFR
ALT: 40 U/L (ref 0–44)
AST: 24 U/L (ref 15–41)
Albumin: 4 g/dL (ref 3.5–5.0)
Alkaline Phosphatase: 59 U/L (ref 38–126)
Anion gap: 10 (ref 5–15)
BUN: 14 mg/dL (ref 6–20)
CO2: 26 mmol/L (ref 22–32)
Calcium: 9.2 mg/dL (ref 8.9–10.3)
Chloride: 101 mmol/L (ref 98–111)
Creatinine, Ser: 0.75 mg/dL (ref 0.44–1.00)
GFR, Estimated: >60 mL/min
Glucose, Bld: 98 mg/dL (ref 70–99)
Potassium: 3.6 mmol/L (ref 3.5–5.1)
Sodium: 137 mmol/L (ref 135–145)
Total Bilirubin: <0.1 mg/dL — ABNORMAL LOW (ref 0.3–1.2)
Total Protein: 7.4 g/dL (ref 6.5–8.1)

## 2020-11-07 LAB — I-STAT BETA HCG BLOOD, ED (MC, WL, AP ONLY): I-stat hCG, quantitative: <5 m[IU]/mL (ref <5)

## 2020-11-07 LAB — LIPASE, BLOOD: Lipase: 23 U/L (ref 11–51)

## 2020-11-07 NOTE — ED Notes (Signed)
Notified dr Lanny Cramp of patient's condition

## 2020-11-07 NOTE — ED Provider Notes (Signed)
[facility]    CSN: [id number] Arrival date & time: 11/07/20  [place]      History   Chief Complaint Chief Complaint  Patient presents with  . Abdominal Pain    HPI Tracy Mejia is a 35 y.o. female comes to urgent care with sudden onset right lower quadrant abdominal pain.  Pain started about 2-1/2 hours ago.  Pain is severe, currently 9 out of 10 and patient has not tried any over-the-counter medications.  She has had some nausea today but no vomiting.  Patient had some diarrheal bowel movements yesterday.  No change in diet or no recent travel.  No sick contacts.  No fever, chills or generalized body aches.   HPI  Past Medical History:  Diagnosis Date  . Asthma    exercise induced  . Heart murmur   . History of chicken pox   . Thyroid disease     Patient Active Problem List   Diagnosis Date Noted  . Current moderate episode of major depressive disorder without prior episode ([place]) 01/28/2018  . Hepatic steatosis 12/09/2017  . Irritable bowel syndrome with diarrhea 12/09/2017  . Elevated liver enzymes 08/22/2017  . Chronic tension-type headache, not intractable 10/05/2015  . Dizziness 10/05/2015  . Excessive daytime sleepiness 10/05/2015  . Left knee injury 10/05/2015  . Snoring 10/05/2015  . Uterine prolapse 01/05/2015  . SUI (stress urinary incontinence, female) 12/29/2014  . Pharyngoesophageal dysphagia 10/04/2014  . Inflammation of joint of right knee 08/31/2014  . Anxiety state, unspecified 04/13/2014  . Rash and nonspecific skin eruption 12/08/2012  . Encounter for routine gynecological examination 12/08/2012  . Depression 10/15/2012  . Headache(784.0) 10/15/2012  . Headache 10/15/2012  . ADJ DISORDER WITH MIXED ANXIETY & DEPRESSED MOOD 12/23/2009  . IRRITABILITY 12/23/2009  . THYROIDITIS 11/12/2008  . URINALYSIS, ABNORMAL 11/12/2008  . Hyperuricuria 11/12/2008  . SEBORRHEA 10/01/2008  . History of hypothyroidism 12/18/2007  . History of  endocrine, metabolic or immunity disorder 12/18/2007  . PLANTAR WART 10/31/2007  . Low back pain 10/31/2007  . GOITER, SIMPLE 07/16/2007  . DISORDER, ADJUSTMENT W/DEPRESSED MOOD 07/16/2007  . ADHD 07/16/2007  . Attention-deficit hyperactivity disorder, unspecified type 07/16/2007  . ANEMIA-NOS 07/08/2007  . OTITIS EXTERNA, ACUTE NEC 07/08/2007  . Acute actinic otitis externa 07/08/2007  . Generalized anxiety disorder 02/12/2005  . Mild intermittent asthma 02/12/2005    Past Surgical History:  Procedure Laterality Date  . ABDOMINAL HYSTERECTOMY    . CHOLECYSTECTOMY  2010  . TUBAL LIGATION  2012  . WISDOM TOOTH EXTRACTION  2003    OB History   No obstetric history on file.      Home Medications    Prior to Admission medications   Medication Sig Start Date End Date Taking? Authorizing Provider  albuterol (PROVENTIL HFA;VENTOLIN HFA) 108 (90 BASE) MCG/ACT inhaler Inhale 2 puffs into the lungs every 6 (six) hours as needed. 12/08/12   Debbrah Alar, NP  aspirin-acetaminophen-caffeine (EXCEDRIN MIGRAINE) [id number] MG tablet Take by mouth.    [provider]  gabapentin (NEURONTIN) 100 MG capsule Take 1 capsule (100 mg total) by mouth 3 (three) times daily. 11/21/18   Edrick Kins, DPM  ibuprofen (ADVIL,MOTRIN) 800 MG tablet Take by mouth. 05/13/15   [provider]  colchicine 0.6 MG tablet Take 1 tablet (0.6 mg total) by mouth daily. 07/23/18 11/07/20  Edrick Kins, DPM  dicyclomine (BENTYL) 20 MG tablet Take by mouth. 07/31/17 11/07/20  [provider]  DULoxetine (CYMBALTA) 30 MG  capsule Take one capsule daily for one week then take 2 capsules daily 08/01/16 11/07/20  [provider]  ferrous fumarate (HEMOCYTE - 106 MG FE) 325 (106 FE) MG TABS Take 1 tablet by mouth.  11/07/20  [provider]  fluticasone (FLONASE) 50 MCG/ACT nasal spray 2 sprays by Each Nare route daily. 07/11/16 11/07/20  [provider]  hyoscyamine  (LEVSIN SL) 0.125 MG SL tablet Place under the tongue. 12/27/16 11/07/20  [provider]  sertraline (ZOLOFT) 50 MG tablet 1/2 tab by mouth once daily for 1 week, then increase to a full tab once daily on week two. Patient not taking: Reported on 11/07/2020 04/13/14 11/07/20  Debbrah Alar, NP  topiramate (TOPAMAX) 25 MG tablet Titrate 1 tab QHS 1st wk, 2 tabs QHS 2nd wk, 3 tabs QHS 3rd wk, 4 tabs QHS 4th wk 12/27/16 11/07/20  [provider]  traZODone (DESYREL) 50 MG tablet Take 1/2 tablet at bedtime for one week then take one tablet at bedtime 08/01/16 11/07/20  [provider]    Family History Family History  Problem Relation Age of Onset  . Cancer Paternal Grandfather        melanoma, prostate cancer  . Diabetes Mother   . Hypertension Mother   . Cancer Mother        breast  . Stroke Father   . Factor V Leiden deficiency Father   . Heart disease Neg Hx     Social History Social History   Tobacco Use  . Smoking status: Current Some Day Smoker    Types: Cigarettes    Last attempt to quit: 11/10/2012    Years since quitting: 7.9  . Smokeless tobacco: Never Used  . Tobacco comment: 4-5 (1 pack weekly) cigarettes daily  Substance Use Topics  . Alcohol use: Yes    Comment: occasional     Allergies   Cortisone, Morphine, and Liraglutide   Review of Systems Review of Systems  Constitutional: Negative.   HENT: Negative.   Respiratory: Negative.   Gastrointestinal: Positive for abdominal pain, diarrhea and nausea. Negative for vomiting.  Genitourinary: Negative.   Neurological: Negative.      Physical Exam Triage Vital Signs ED Triage Vitals  Enc Vitals Group     BP 11/07/20 1756 116/75     Pulse Rate 11/07/20 1756 90     Resp 11/07/20 1756 20     Temp 11/07/20 1756 99 F (37.2 C)     Temp Source 11/07/20 1756 Oral     SpO2 11/07/20 1756 98 %     Weight --      Height --      Head Circumference --      Peak Flow --      Pain Score  11/07/20 1751 9     Pain Loc --      Pain Edu? --      Excl. in [place]? --    No data found.  Updated Vital Signs BP 116/75 (BP Location: Right Arm) Comment (BP Location): large cuff  Pulse 90   Temp 99 F (37.2 C) (Oral)   Resp 20   LMP 03/24/2014   SpO2 98%   Visual Acuity Right Eye Distance:   Left Eye Distance:   Bilateral Distance:    Right Eye Near:   Left Eye Near:    Bilateral Near:     Physical Exam Vitals and nursing note reviewed.  Constitutional:      General: She is  in acute distress.     Appearance: She is ill-appearing.  Abdominal:     General: Bowel sounds are normal.     Palpations: Abdomen is soft.     Tenderness: There is abdominal tenderness in the right lower quadrant. There is rebound. There is no guarding.     Hernia: No hernia is present.  Neurological:     Mental Status: She is alert.      UC Treatments / Results  Labs (all labs ordered are listed, but only abnormal results are displayed) Labs Reviewed - No data to display  EKG   Radiology No results found.  Procedures Procedures (including critical care time)  Medications Ordered in UC Medications - No data to display  Initial Impression / Assessment and Plan / UC Course  I have reviewed the triage vital signs and the nursing notes.  Pertinent labs & imaging results that were available during my care of the patient were reviewed by me and considered in my medical decision making (see chart for details).     1.  Right lower quadrant abdominal pain: Patient has severe abdominal pain. She will need imaging which is not available in the urgent care Patient is advised to go to the emergency department for further work-up Patient agrees with plan of care. Final Clinical Impressions(s) / UC Diagnoses   Final diagnoses:  Right lower quadrant pain     Discharge Instructions     You have severe abdominal pain which requires further work-up in the emergency department. You  will need abdominal imaging to help shed more light on your medical complaint.   ED Prescriptions    None     PDMP not reviewed this encounter.   Chase Picket, MD 11/07/20 [phone]

## 2020-11-07 NOTE — ED Notes (Signed)
Patient is being discharged from the Urgent Care and sent to the Emergency Department via POV. Per Lynford Humphrey, MD, patient is in need of higher level of care due to severe abdominal pain. Patient is aware and verbalizes understanding of plan of care.  Vitals:   11/07/20 1756  BP: 116/75  Pulse: 90  Resp: 20  Temp: 99 F (37.2 C)  SpO2: 98%

## 2020-11-07 NOTE — ED Triage Notes (Signed)
Abdominal pain started 2 1/2 hours ago.  Patient has nausea, no vomiting, no diarrhea.  Denies fever. Denies urinary symptoms.  Pain is right lower abdominal pain

## 2020-11-07 NOTE — ED Triage Notes (Signed)
Patient complaining of flank pain that began today while working. Patient A&Ox4, NAD.

## 2020-11-07 NOTE — Discharge Instructions (Signed)
You have severe abdominal pain which requires further work-up in the emergency department. You will need abdominal imaging to help shed more light on your medical complaint.

## 2020-11-08 ENCOUNTER — Encounter (HOSPITAL_COMMUNITY): Payer: Self-pay

## 2020-11-08 ENCOUNTER — Emergency Department (HOSPITAL_COMMUNITY): Payer: 59

## 2020-11-08 ENCOUNTER — Observation Stay (HOSPITAL_COMMUNITY): Payer: 59 | Admitting: Anesthesiology

## 2020-11-08 ENCOUNTER — Encounter (HOSPITAL_COMMUNITY)
Admission: EM | Disposition: A | Discharge status: Home / Self Care | Payer: Self-pay | Source: Home / Self Care | Attending: Emergency Medicine

## 2020-11-08 DIAGNOSIS — R109 Unspecified abdominal pain: Secondary | ICD-10-CM | POA: Diagnosis not present

## 2020-11-08 DIAGNOSIS — R1031 Right lower quadrant pain: Secondary | ICD-10-CM | POA: Diagnosis not present

## 2020-11-08 DIAGNOSIS — J4520 Mild intermittent asthma, uncomplicated: Secondary | ICD-10-CM | POA: Diagnosis not present

## 2020-11-08 DIAGNOSIS — E039 Hypothyroidism, unspecified: Secondary | ICD-10-CM | POA: Diagnosis not present

## 2020-11-08 DIAGNOSIS — C7A8 Other malignant neuroendocrine tumors: Secondary | ICD-10-CM | POA: Diagnosis not present

## 2020-11-08 DIAGNOSIS — J45909 Unspecified asthma, uncomplicated: Secondary | ICD-10-CM | POA: Diagnosis not present

## 2020-11-08 DIAGNOSIS — D3A8 Other benign neuroendocrine tumors: Secondary | ICD-10-CM | POA: Diagnosis not present

## 2020-11-08 DIAGNOSIS — K3580 Unspecified acute appendicitis: Secondary | ICD-10-CM | POA: Diagnosis not present

## 2020-11-08 DIAGNOSIS — F17210 Nicotine dependence, cigarettes, uncomplicated: Secondary | ICD-10-CM | POA: Diagnosis not present

## 2020-11-08 DIAGNOSIS — R9431 Abnormal electrocardiogram [ECG] [EKG]: Secondary | ICD-10-CM | POA: Diagnosis not present

## 2020-11-08 DIAGNOSIS — F418 Other specified anxiety disorders: Secondary | ICD-10-CM | POA: Diagnosis not present

## 2020-11-08 DIAGNOSIS — Z79899 Other long term (current) drug therapy: Secondary | ICD-10-CM | POA: Diagnosis not present

## 2020-11-08 DIAGNOSIS — Z20822 Contact with and (suspected) exposure to covid-19: Secondary | ICD-10-CM | POA: Diagnosis not present

## 2020-11-08 HISTORY — PX: LAPAROSCOPIC APPENDECTOMY: SHX408

## 2020-11-08 LAB — CBC WITH DIFFERENTIAL/PLATELET
Abs Immature Granulocytes: 0.01 10*3/uL (ref 0.00–0.07)
Basophils Absolute: 0 10*3/uL (ref 0.0–0.1)
Basophils Relative: 0 %
Eosinophils Absolute: 0.1 10*3/uL (ref 0.0–0.5)
Eosinophils Relative: 1 %
HCT: 35.5 % — ABNORMAL LOW (ref 36.0–46.0)
Hemoglobin: 12.3 g/dL (ref 12.0–15.0)
Immature Granulocytes: 0 %
Lymphocytes Relative: 29 %
Lymphs Abs: 1.9 10*3/uL (ref 0.7–4.0)
MCH: 29.1 pg (ref 26.0–34.0)
MCHC: 34.6 g/dL (ref 30.0–36.0)
MCV: 84.1 fL (ref 80.0–100.0)
Monocytes Absolute: 0.4 10*3/uL (ref 0.1–1.0)
Monocytes Relative: 6 %
Neutro Abs: 4.2 10*3/uL (ref 1.7–7.7)
Neutrophils Relative %: 64 %
Platelets: 238 10*3/uL (ref 150–400)
RBC: 4.22 MIL/uL (ref 3.87–5.11)
RDW: 13.4 % (ref 11.5–15.5)
WBC: 6.6 10*3/uL (ref 4.0–10.5)
nRBC: 0 % (ref 0.0–0.2)

## 2020-11-08 LAB — RESP PANEL BY RT-PCR (FLU A&B, COVID) ARPGX2
Influenza A by PCR: NEGATIVE
Influenza B by PCR: NEGATIVE
SARS Coronavirus 2 by RT PCR: NEGATIVE

## 2020-11-08 LAB — PROCALCITONIN: Procalcitonin: <0.1 ng/mL

## 2020-11-08 LAB — URINALYSIS, ROUTINE W REFLEX MICROSCOPIC
Bacteria, UA: NONE SEEN
Bilirubin Urine: NEGATIVE
Glucose, UA: NEGATIVE mg/dL
Hgb urine dipstick: NEGATIVE
Ketones, ur: NEGATIVE mg/dL
Nitrite: NEGATIVE
Protein, ur: NEGATIVE mg/dL
Specific Gravity, Urine: 1.014 (ref 1.005–1.030)
pH: 6 (ref 5.0–8.0)

## 2020-11-08 LAB — FERRITIN: Ferritin: 51 ng/mL (ref 11–307)

## 2020-11-08 LAB — LACTIC ACID, PLASMA
Lactic Acid, Venous: 0.9 mmol/L (ref 0.5–1.9)
Lactic Acid, Venous: 1.1 mmol/L (ref 0.5–1.9)

## 2020-11-08 LAB — SURGICAL PCR SCREEN
MRSA, PCR: NEGATIVE
Staphylococcus aureus: NEGATIVE

## 2020-11-08 LAB — LACTATE DEHYDROGENASE: LDH: 170 U/L (ref 98–192)

## 2020-11-08 LAB — D-DIMER, QUANTITATIVE: D-Dimer, Quant: <0.27 ug/mL-FEU (ref 0.00–0.50)

## 2020-11-08 LAB — I-STAT BETA HCG BLOOD, ED (NOT ORDERABLE): I-stat hCG, quantitative: <5 m[IU]/mL (ref <5)

## 2020-11-08 LAB — TRIGLYCERIDES: Triglycerides: 42 mg/dL (ref <150)

## 2020-11-08 LAB — FIBRINOGEN: Fibrinogen: 228 mg/dL (ref 210–475)

## 2020-11-08 LAB — C-REACTIVE PROTEIN: CRP: <0.5 mg/dL (ref <1.0)

## 2020-11-08 LAB — HIV ANTIBODY (ROUTINE TESTING W REFLEX): HIV Screen 4th Generation wRfx: NONREACTIVE

## 2020-11-08 SURGERY — APPENDECTOMY, LAPAROSCOPIC
Anesthesia: General | Site: Abdomen

## 2020-11-08 MED ORDER — HYDROMORPHONE HCL 1 MG/ML IJ SOLN
0.5000 mg | INTRAMUSCULAR | Status: DC | PRN
  Administered 2020-11-08 – 2020-11-09 (×2): 1 mg via INTRAVENOUS
  Filled 2020-11-08 (×2): qty 1

## 2020-11-08 MED ORDER — SUCCINYLCHOLINE CHLORIDE 200 MG/10ML IV SOSY
PREFILLED_SYRINGE | INTRAVENOUS | Status: AC
  Filled 2020-11-08: qty 10

## 2020-11-08 MED ORDER — SUCCINYLCHOLINE CHLORIDE 20 MG/ML IJ SOLN
INTRAMUSCULAR | Status: DC | PRN
  Administered 2020-11-08: 120 mg via INTRAVENOUS

## 2020-11-08 MED ORDER — 0.9 % SODIUM CHLORIDE (POUR BTL) OPTIME
TOPICAL | Status: DC | PRN
  Administered 2020-11-08: 1000 mL

## 2020-11-08 MED ORDER — OXYCODONE HCL 5 MG/5ML PO SOLN: 5.0000 mg | Freq: Once | ORAL | Status: DC | PRN

## 2020-11-08 MED ORDER — ONDANSETRON HCL 4 MG/2ML IJ SOLN
4.0000 mg | Freq: Once | INTRAMUSCULAR | Status: AC
  Administered 2020-11-08: 4 mg via INTRAVENOUS
  Filled 2020-11-08: qty 2

## 2020-11-08 MED ORDER — SODIUM CHLORIDE 0.9 % IV SOLN: INTRAVENOUS | Status: DC

## 2020-11-08 MED ORDER — PHENYLEPHRINE HCL-NACL 10-0.9 MG/250ML-% IV SOLN
INTRAVENOUS | Status: DC | PRN
  Administered 2020-11-08: 50 ug/min via INTRAVENOUS

## 2020-11-08 MED ORDER — ACETAMINOPHEN 325 MG PO TABS
650.0000 mg | ORAL_TABLET | Freq: Four times a day (QID) | ORAL | Status: DC | PRN
  Administered 2020-11-08: 650 mg via ORAL
  Filled 2020-11-08: qty 2

## 2020-11-08 MED ORDER — KETOROLAC TROMETHAMINE 30 MG/ML IJ SOLN
30.0000 mg | Freq: Three times a day (TID) | INTRAMUSCULAR | Status: DC
  Administered 2020-11-08 – 2020-11-09 (×2): 30 mg via INTRAVENOUS
  Filled 2020-11-08 (×2): qty 1

## 2020-11-08 MED ORDER — OXYCODONE HCL 5 MG PO TABS: 5.0000 mg | ORAL_TABLET | Freq: Once | ORAL | Status: DC | PRN

## 2020-11-08 MED ORDER — ACETAMINOPHEN 650 MG RE SUPP: 650.0000 mg | Freq: Four times a day (QID) | RECTAL | Status: DC | PRN

## 2020-11-08 MED ORDER — OXYCODONE HCL 5 MG PO TABS
5.0000 mg | ORAL_TABLET | ORAL | Status: DC | PRN
  Administered 2020-11-09: 10 mg via ORAL
  Filled 2020-11-08: qty 2

## 2020-11-08 MED ORDER — DIPHENHYDRAMINE HCL 25 MG PO CAPS: 25.0000 mg | ORAL_CAPSULE | Freq: Four times a day (QID) | ORAL | Status: DC | PRN

## 2020-11-08 MED ORDER — BUPIVACAINE HCL (PF) 0.25 % IJ SOLN
INTRAMUSCULAR | Status: AC
  Filled 2020-11-08: qty 30

## 2020-11-08 MED ORDER — LACTATED RINGERS IV SOLN: INTRAVENOUS | Status: DC | PRN

## 2020-11-08 MED ORDER — LIDOCAINE 2% (20 MG/ML) 5 ML SYRINGE
INTRAMUSCULAR | Status: AC
  Filled 2020-11-08: qty 5

## 2020-11-08 MED ORDER — ACETAMINOPHEN 500 MG PO TABS
1000.0000 mg | ORAL_TABLET | Freq: Four times a day (QID) | ORAL | Status: DC
  Administered 2020-11-09: 1000 mg via ORAL
  Filled 2020-11-08: qty 2

## 2020-11-08 MED ORDER — LIDOCAINE 2% (20 MG/ML) 5 ML SYRINGE
INTRAMUSCULAR | Status: DC | PRN
  Administered 2020-11-08: 100 mg via INTRAVENOUS

## 2020-11-08 MED ORDER — MIDAZOLAM HCL 2 MG/2ML IJ SOLN
INTRAMUSCULAR | Status: AC
  Filled 2020-11-08: qty 2

## 2020-11-08 MED ORDER — DEXAMETHASONE SODIUM PHOSPHATE 10 MG/ML IJ SOLN
INTRAMUSCULAR | Status: DC | PRN
  Administered 2020-11-08: 10 mg via INTRAVENOUS

## 2020-11-08 MED ORDER — FENTANYL CITRATE (PF) 100 MCG/2ML IJ SOLN
100.0000 ug | Freq: Once | INTRAMUSCULAR | Status: AC
  Administered 2020-11-08: 100 ug via INTRAVENOUS
  Filled 2020-11-08: qty 2

## 2020-11-08 MED ORDER — OXYCODONE-ACETAMINOPHEN 5-325 MG PO TABS: 1.0000 | ORAL_TABLET | ORAL | 0 refills | Status: DC | PRN

## 2020-11-08 MED ORDER — ROCURONIUM BROMIDE 10 MG/ML (PF) SYRINGE
PREFILLED_SYRINGE | INTRAVENOUS | Status: AC
  Filled 2020-11-08: qty 10

## 2020-11-08 MED ORDER — SCOPOLAMINE 1 MG/3DAYS TD PT72
1.0000 | MEDICATED_PATCH | TRANSDERMAL | Status: DC
  Administered 2020-11-08: 1.5 mg via TRANSDERMAL
  Filled 2020-11-08: qty 1

## 2020-11-08 MED ORDER — ONDANSETRON 4 MG PO TBDP
4.0000 mg | ORAL_TABLET | Freq: Four times a day (QID) | ORAL | Status: DC | PRN
  Administered 2020-11-09: 4 mg via ORAL
  Filled 2020-11-08: qty 1

## 2020-11-08 MED ORDER — PROPOFOL 10 MG/ML IV BOLUS
INTRAVENOUS | Status: DC | PRN
  Administered 2020-11-08: 180 mg via INTRAVENOUS

## 2020-11-08 MED ORDER — ROCURONIUM BROMIDE 10 MG/ML (PF) SYRINGE
PREFILLED_SYRINGE | INTRAVENOUS | Status: DC | PRN
  Administered 2020-11-08: 30 mg via INTRAVENOUS

## 2020-11-08 MED ORDER — FENTANYL CITRATE (PF) 250 MCG/5ML IJ SOLN
INTRAMUSCULAR | Status: DC | PRN
  Administered 2020-11-08: 100 ug via INTRAVENOUS

## 2020-11-08 MED ORDER — ORAL CARE MOUTH RINSE: 15.0000 mL | Freq: Once | OROMUCOSAL | Status: AC

## 2020-11-08 MED ORDER — HYDROMORPHONE HCL 1 MG/ML IJ SOLN
0.5000 mg | INTRAMUSCULAR | Status: DC | PRN
  Administered 2020-11-08: 0.5 mg via INTRAVENOUS
  Filled 2020-11-08 (×2): qty 1

## 2020-11-08 MED ORDER — IOHEXOL 300 MG/ML  SOLN
100.0000 mL | Freq: Once | INTRAMUSCULAR | Status: AC | PRN
  Administered 2020-11-08: 100 mL via INTRAVENOUS

## 2020-11-08 MED ORDER — FENTANYL CITRATE (PF) 100 MCG/2ML IJ SOLN
50.0000 ug | Freq: Once | INTRAMUSCULAR | Status: AC
  Administered 2020-11-08: 50 ug via INTRAVENOUS
  Filled 2020-11-08: qty 2

## 2020-11-08 MED ORDER — SODIUM CHLORIDE 0.9 % IV SOLN
2.0000 g | INTRAVENOUS | Status: AC
  Administered 2020-11-08: 2 g via INTRAVENOUS
  Filled 2020-11-08: qty 20
  Filled 2020-11-08: qty 2

## 2020-11-08 MED ORDER — DIPHENHYDRAMINE HCL 50 MG/ML IJ SOLN: 25.0000 mg | Freq: Four times a day (QID) | INTRAMUSCULAR | Status: DC | PRN

## 2020-11-08 MED ORDER — METRONIDAZOLE IN NACL 5-0.79 MG/ML-% IV SOLN
500.0000 mg | INTRAVENOUS | Status: AC
  Administered 2020-11-08: 500 mg via INTRAVENOUS
  Filled 2020-11-08: qty 100

## 2020-11-08 MED ORDER — FENTANYL CITRATE (PF) 250 MCG/5ML IJ SOLN
INTRAMUSCULAR | Status: AC
  Filled 2020-11-08: qty 5

## 2020-11-08 MED ORDER — STERILE WATER FOR IRRIGATION IR SOLN
Status: DC | PRN
  Administered 2020-11-08: 1000 mL

## 2020-11-08 MED ORDER — SUGAMMADEX SODIUM 200 MG/2ML IV SOLN
INTRAVENOUS | Status: DC | PRN
  Administered 2020-11-08: 200 mg via INTRAVENOUS

## 2020-11-08 MED ORDER — DEXAMETHASONE SODIUM PHOSPHATE 10 MG/ML IJ SOLN
INTRAMUSCULAR | Status: AC
  Filled 2020-11-08: qty 1

## 2020-11-08 MED ORDER — LACTATED RINGERS IV SOLN: INTRAVENOUS | Status: DC

## 2020-11-08 MED ORDER — MIDAZOLAM HCL 5 MG/5ML IJ SOLN
INTRAMUSCULAR | Status: DC | PRN
  Administered 2020-11-08: 2 mg via INTRAVENOUS

## 2020-11-08 MED ORDER — ONDANSETRON HCL 4 MG/2ML IJ SOLN
INTRAMUSCULAR | Status: AC
  Filled 2020-11-08: qty 2

## 2020-11-08 MED ORDER — CHLORHEXIDINE GLUCONATE 0.12 % MT SOLN
OROMUCOSAL | Status: AC
  Administered 2020-11-08: 15 mL via OROMUCOSAL
  Filled 2020-11-08: qty 15

## 2020-11-08 MED ORDER — HYDROCODONE-ACETAMINOPHEN 5-325 MG PO TABS
1.0000 | ORAL_TABLET | Freq: Once | ORAL | Status: AC
  Administered 2020-11-08: 1 via ORAL
  Filled 2020-11-08: qty 1

## 2020-11-08 MED ORDER — DROPERIDOL 2.5 MG/ML IJ SOLN
INTRAMUSCULAR | Status: AC
  Administered 2020-11-08: 0.625 mg
  Filled 2020-11-08: qty 2

## 2020-11-08 MED ORDER — FENTANYL CITRATE (PF) 100 MCG/2ML IJ SOLN
25.0000 ug | INTRAMUSCULAR | Status: DC | PRN
Start: 2020-11-08 — End: 2020-11-08

## 2020-11-08 MED ORDER — BUPIVACAINE HCL 0.25 % IJ SOLN
INTRAMUSCULAR | Status: DC | PRN
  Administered 2020-11-08: 6 mL

## 2020-11-08 MED ORDER — DROPERIDOL 2.5 MG/ML IJ SOLN: 0.6250 mg | Freq: Once | INTRAMUSCULAR | Status: DC

## 2020-11-08 MED ORDER — PROPOFOL 10 MG/ML IV BOLUS
INTRAVENOUS | Status: AC
  Filled 2020-11-08: qty 20

## 2020-11-08 MED ORDER — PROMETHAZINE HCL 25 MG/ML IJ SOLN: 6.2500 mg | INTRAMUSCULAR | Status: DC | PRN

## 2020-11-08 MED ORDER — ONDANSETRON HCL 4 MG/2ML IJ SOLN
4.0000 mg | Freq: Four times a day (QID) | INTRAMUSCULAR | Status: DC | PRN
  Administered 2020-11-08 (×2): 4 mg via INTRAVENOUS
  Filled 2020-11-08 (×2): qty 2

## 2020-11-08 MED ORDER — SODIUM CHLORIDE 0.9 % IV SOLN: 2.0000 g | INTRAVENOUS | Status: DC

## 2020-11-08 MED ORDER — SODIUM CHLORIDE 0.9 % IR SOLN
Status: DC | PRN
  Administered 2020-11-08: 1000 mL

## 2020-11-08 MED ORDER — CHLORHEXIDINE GLUCONATE 0.12 % MT SOLN: 15.0000 mL | Freq: Once | OROMUCOSAL | Status: AC

## 2020-11-08 SURGICAL SUPPLY — 39 items
ADH SKN CLS APL DERMABOND .7 (GAUZE/BANDAGES/DRESSINGS) ×1
APL PRP STRL LF DISP 70% ISPRP (MISCELLANEOUS) ×1
BLADE CLIPPER SURG (BLADE) IMPLANT
CANISTER SUCT 3000ML PPV (MISCELLANEOUS) ×2 IMPLANT
CHLORAPREP W/TINT 26 (MISCELLANEOUS) ×2 IMPLANT
CLIP VESOLOCK XL 6/CT (CLIP) ×2 IMPLANT
COVER SURGICAL LIGHT HANDLE (MISCELLANEOUS) ×2 IMPLANT
COVER TRANSDUCER ULTRASND (DRAPES) ×2 IMPLANT
DERMABOND ADVANCED (GAUZE/BANDAGES/DRESSINGS) ×1
DERMABOND ADVANCED .7 DNX12 (GAUZE/BANDAGES/DRESSINGS) ×1 IMPLANT
ELECT REM PT RETURN 9FT ADLT (ELECTROSURGICAL) ×2
ELECTRODE REM PT RTRN 9FT ADLT (ELECTROSURGICAL) ×1 IMPLANT
GLOVE BIO SURGEON STRL SZ7.5 (GLOVE) ×2 IMPLANT
GOWN STRL REUS W/ TWL LRG LVL3 (GOWN DISPOSABLE) ×2 IMPLANT
GOWN STRL REUS W/ TWL XL LVL3 (GOWN DISPOSABLE) ×1 IMPLANT
GOWN STRL REUS W/TWL LRG LVL3 (GOWN DISPOSABLE) ×4
GOWN STRL REUS W/TWL XL LVL3 (GOWN DISPOSABLE) ×2
GRASPER SUT TROCAR 14GX15 (MISCELLANEOUS) ×2 IMPLANT
KIT BASIN OR (CUSTOM PROCEDURE TRAY) ×2 IMPLANT
KIT TURNOVER KIT B (KITS) ×2 IMPLANT
NDL INSUFFLATION 14GA 120MM (NEEDLE) ×1 IMPLANT
NEEDLE INSUFFLATION 14GA 120MM (NEEDLE) ×2 IMPLANT
NS IRRIG 1000ML POUR BTL (IV SOLUTION) ×2 IMPLANT
PAD ARMBOARD 7.5X6 YLW CONV (MISCELLANEOUS) ×4 IMPLANT
SCISSORS LAP 5X35 DISP (ENDOMECHANICALS) ×2 IMPLANT
SET IRRIG TUBING LAPAROSCOPIC (IRRIGATION / IRRIGATOR) ×2 IMPLANT
SET TUBE SMOKE EVAC HIGH FLOW (TUBING) ×2 IMPLANT
SLEEVE ENDOPATH XCEL 5M (ENDOMECHANICALS) ×2 IMPLANT
SPECIMEN JAR SMALL (MISCELLANEOUS) ×2 IMPLANT
SUT MNCRL AB 4-0 PS2 18 (SUTURE) ×3 IMPLANT
TOWEL GREEN STERILE (TOWEL DISPOSABLE) ×2 IMPLANT
TOWEL GREEN STERILE FF (TOWEL DISPOSABLE) ×2 IMPLANT
TRAY FOLEY W/BAG SLVR 16FR (SET/KITS/TRAYS/PACK) ×2
TRAY FOLEY W/BAG SLVR 16FR ST (SET/KITS/TRAYS/PACK) ×1 IMPLANT
TRAY LAPAROSCOPIC MC (CUSTOM PROCEDURE TRAY) ×2 IMPLANT
TROCAR XCEL NON-BLD 11X100MML (ENDOMECHANICALS) ×2 IMPLANT
TROCAR XCEL NON-BLD 5MMX100MML (ENDOMECHANICALS) ×2 IMPLANT
WARMER LAPAROSCOPE (MISCELLANEOUS) ×2 IMPLANT
WATER STERILE IRR 1000ML POUR (IV SOLUTION) ×2 IMPLANT

## 2020-11-08 NOTE — ED Notes (Signed)
Secretary called for transport to [facility]

## 2020-11-08 NOTE — ED Provider Notes (Signed)
[facility] EMERGENCY DEPARTMENT Provider Note   CSN: [id number] Arrival date & time: 11/07/20  1824     History Chief Complaint  Patient presents with  . Abdominal Pain    Tracy Mejia is a 35 y.o. female.  The history is provided by the patient.  Abdominal Pain Pain location:  RLQ Pain quality: burning and sharp   Pain radiates to:  Does not radiate Pain severity:  Severe Onset quality:  Sudden Duration:  12 hours Timing:  Constant Progression:  Worsening Chronicity:  New Relieved by:  Nothing Worsened by:  Movement and palpation (walking) Associated symptoms: no chest pain, no cough, no dysuria and no vomiting    Patient with history of asthma, thyroid disease presents with abdominal pain.  Patient works at the lab at [employer].  She reports while at work she had onset of right lower quadrant abdominal pain that is sharp and burning.  It does not radiate.  She has never had this before It is worse with movement, palpation and walking.  No fevers or vomiting. She was seen in urgent care and sent for further evaluation   Patient reports previous history of cholecystectomy and total abdominal hysterectomy Past Medical History:  Diagnosis Date  . Asthma    exercise induced  . Heart murmur   . History of chicken pox   . Thyroid disease     Patient Active Problem List   Diagnosis Date Noted  . Current moderate episode of major depressive disorder without prior episode ([place]) 01/28/2018  . Hepatic steatosis 12/09/2017  . Irritable bowel syndrome with diarrhea 12/09/2017  . Elevated liver enzymes 08/22/2017  . Chronic tension-type headache, not intractable 10/05/2015  . Dizziness 10/05/2015  . Excessive daytime sleepiness 10/05/2015  . Left knee injury 10/05/2015  . Snoring 10/05/2015  . Uterine prolapse 01/05/2015  . SUI (stress urinary incontinence, female) 12/29/2014  . Pharyngoesophageal dysphagia 10/04/2014  . Inflammation of joint of right  knee 08/31/2014  . Anxiety state, unspecified 04/13/2014  . Rash and nonspecific skin eruption 12/08/2012  . Encounter for routine gynecological examination 12/08/2012  . Depression 10/15/2012  . Headache(784.0) 10/15/2012  . Headache 10/15/2012  . ADJ DISORDER WITH MIXED ANXIETY & DEPRESSED MOOD 12/23/2009  . IRRITABILITY 12/23/2009  . THYROIDITIS 11/12/2008  . URINALYSIS, ABNORMAL 11/12/2008  . Hyperuricuria 11/12/2008  . SEBORRHEA 10/01/2008  . History of hypothyroidism 12/18/2007  . History of endocrine, metabolic or immunity disorder 12/18/2007  . PLANTAR WART 10/31/2007  . Low back pain 10/31/2007  . GOITER, SIMPLE 07/16/2007  . DISORDER, ADJUSTMENT W/DEPRESSED MOOD 07/16/2007  . ADHD 07/16/2007  . Attention-deficit hyperactivity disorder, unspecified type 07/16/2007  . ANEMIA-NOS 07/08/2007  . OTITIS EXTERNA, ACUTE NEC 07/08/2007  . Acute actinic otitis externa 07/08/2007  . Generalized anxiety disorder 02/12/2005  . Mild intermittent asthma 02/12/2005    Past Surgical History:  Procedure Laterality Date  . ABDOMINAL HYSTERECTOMY    . CHOLECYSTECTOMY  2010  . TUBAL LIGATION  2012  . WISDOM TOOTH EXTRACTION  2003     OB History   No obstetric history on file.     Family History  Problem Relation Age of Onset  . Cancer Paternal Grandfather        melanoma, prostate cancer  . Diabetes Mother   . Hypertension Mother   . Cancer Mother        breast  . Stroke Father   . Factor V Leiden deficiency Father   . Heart disease  Neg Hx     Social History   Tobacco Use  . Smoking status: Current Some Day Smoker    Types: Cigarettes    Last attempt to quit: 11/10/2012    Years since quitting: 8.0  . Smokeless tobacco: Never Used  . Tobacco comment: 4-5 (1 pack weekly) cigarettes daily  Substance Use Topics  . Alcohol use: Yes    Comment: occasional    Home Medications Prior to Admission medications   Medication Sig Start Date End Date Taking? Authorizing  Provider  albuterol (PROVENTIL HFA;VENTOLIN HFA) 108 (90 BASE) MCG/ACT inhaler Inhale 2 puffs into the lungs every 6 (six) hours as needed. 12/08/12   Debbrah Alar, NP  aspirin-acetaminophen-caffeine (EXCEDRIN MIGRAINE) [phone] MG tablet Take by mouth.    [provider]  gabapentin (NEURONTIN) 100 MG capsule Take 1 capsule (100 mg total) by mouth 3 (three) times daily. 11/21/18   Edrick Kins, DPM  ibuprofen (ADVIL,MOTRIN) 800 MG tablet Take by mouth. 05/13/15   [provider]  colchicine 0.6 MG tablet Take 1 tablet (0.6 mg total) by mouth daily. 07/23/18 11/07/20  Edrick Kins, DPM  dicyclomine (BENTYL) 20 MG tablet Take by mouth. 07/31/17 11/07/20  [provider]  DULoxetine (CYMBALTA) 30 MG capsule Take one capsule daily for one week then take 2 capsules daily 08/01/16 11/07/20  [provider]  ferrous fumarate (HEMOCYTE - 106 MG FE) 325 (106 FE) MG TABS Take 1 tablet by mouth.  11/07/20  [provider]  fluticasone (FLONASE) 50 MCG/ACT nasal spray 2 sprays by Each Nare route daily. 07/11/16 11/07/20  [provider]  hyoscyamine (LEVSIN SL) 0.125 MG SL tablet Place under the tongue. 12/27/16 11/07/20  [provider]  sertraline (ZOLOFT) 50 MG tablet 1/2 tab by mouth once daily for 1 week, then increase to a full tab once daily on week two. Patient not taking: Reported on 11/07/2020 04/13/14 11/07/20  Debbrah Alar, NP  topiramate (TOPAMAX) 25 MG tablet Titrate 1 tab QHS 1st wk, 2 tabs QHS 2nd wk, 3 tabs QHS 3rd wk, 4 tabs QHS 4th wk 12/27/16 11/07/20  [provider]  traZODone (DESYREL) 50 MG tablet Take 1/2 tablet at bedtime for one week then take one tablet at bedtime 08/01/16 11/07/20  [provider]    Allergies    Cortisone, Morphine, and Liraglutide  Review of Systems   Review of Systems  Respiratory: Negative for cough.   Cardiovascular: Negative for chest pain.  Gastrointestinal: Positive for  abdominal pain. Negative for vomiting.  Genitourinary: Positive for frequency. Negative for dysuria.  All other systems reviewed and are negative.   Physical Exam Updated Vital Signs BP 112/88 (BP Location: Left Arm)   Pulse 78   Temp 97.6 F (36.4 C) (Oral)   Resp 20   LMP 03/24/2014   SpO2 100%   Physical Exam CONSTITUTIONAL: Well developed/well nourished, uncomfortable appearing HEAD: Normocephalic/atraumatic EYES: EOMI/PERRL no scleral icterus ENMT: Mucous membranes moist NECK: supple no meningeal signs SPINE/BACK:entire spine nontender CV: S1/S2 noted, no murmurs/rubs/gallops noted LUNGS: Lungs are clear to auscultation bilaterally, no apparent distress ABDOMEN: soft, moderate RLQ tenderness, no rebound or guarding, bowel sounds noted throughout abdomen GU:no cva tenderness NEURO: Pt is awake/alert/appropriate, moves all extremitiesx4.  No facial droop.   EXTREMITIES: pulses normal/equal, full ROM SKIN: warm, color normal PSYCH: Anxious  ED Results / Procedures / Treatments   Labs (all labs ordered are listed, but only abnormal results are displayed) Labs Reviewed  COMPREHENSIVE  METABOLIC PANEL - Abnormal; Notable for the following components:      Result Value   Total Bilirubin <0.1 (*)    All other components within normal limits  URINALYSIS, ROUTINE W REFLEX MICROSCOPIC - Abnormal; Notable for the following components:   Leukocytes,Ua TRACE (*)    All other components within normal limits  LIPASE, BLOOD  CBC  I-STAT BETA HCG BLOOD, ED ([facility], [facility], [facility] ONLY)    EKG None  Radiology CT ABDOMEN PELVIS W CONTRAST  Result Date: 11/08/2020 CLINICAL DATA:  Acute abdominal pain.  Nausea. EXAM: CT ABDOMEN AND PELVIS WITH CONTRAST TECHNIQUE: Multidetector CT imaging of the abdomen and pelvis was performed using the standard protocol following bolus administration of intravenous contrast. CONTRAST:  158mL OMNIPAQUE IOHEXOL 300 MG/ML  SOLN COMPARISON:  CT 08/03/2017  FINDINGS: Lower chest: No focal airspace disease or pleural effusion. Mild elevation of right hemidiaphragm. Hepatobiliary: No focal liver abnormality is seen. Status post cholecystectomy. No biliary dilatation. Pancreas: No ductal dilatation or inflammation. Spleen: Normal in size without focal abnormality. Adrenals/Urinary Tract: Normal adrenal glands. No hydronephrosis or perinephric edema. Homogeneous renal enhancement. Urinary bladder is physiologically distended without wall thickening. Stomach/Bowel: Stomach is unremarkable. Normal positioning of the duodenum and ligament of Treitz. No small bowel obstruction or inflammation. Appendix not definitively visualized, no evidence of appendicitis. Small to moderate colonic stool burden without colonic wall thickening or inflammation. Vascular/Lymphatic: No significant vascular findings are present. No enlarged abdominal or pelvic lymph nodes. Reproductive: Status post hysterectomy. No adnexal masses. Other: No free air, free fluid, or intra-abdominal fluid collection. No abdominal wall hernia. Musculoskeletal: Lumbar spondylosis. There are no acute or suspicious osseous abnormalities. IMPRESSION: No acute abnormality or explanation for abdominal pain. Electronically Signed   By: Keith Rake M.D.   On: 11/08/2020 03:58    Procedures Procedures   Medications Ordered in ED Medications  fentaNYL (SUBLIMAZE) injection 50 mcg (has no administration in time range)  ondansetron (ZOFRAN) injection 4 mg (has no administration in time range)  fentaNYL (SUBLIMAZE) injection 100 mcg (100 mcg Intravenous Given 11/08/20 0309)  ondansetron (ZOFRAN) injection 4 mg (4 mg Intravenous Given 11/08/20 0309)  iohexol (OMNIPAQUE) 300 MG/ML solution 100 mL (100 mLs Intravenous Contrast Given 11/08/20 0350)  HYDROcodone-acetaminophen (NORCO/VICODIN) 5-325 MG per tablet 1 tablet (1 tablet Oral Given 11/08/20 8469)    ED Course  I have reviewed the triage vital signs and the  nursing notes.  Pertinent labs & imaging results that were available during my care of the patient were reviewed by me and considered in my medical decision making (see chart for details).    MDM Rules/Calculators/A&P                           This patient presents to the ED for concern of abdominal pain, this involves an extensive number of treatment options, and is a complaint that carries with it a high risk of complications and morbidity.  The differential diagnosis includes appendicitis, UTI, kidney stone, bowel perforation, bowel obstruction   Lab Tests:   I Ordered, reviewed, and interpreted labs, which included electrolytes, complete blood count, urinalysis  Medicines ordered:   I ordered medication fentanyl for pain  Imaging Studies ordered:   I ordered imaging studies which included CT abdomen/pelvis   I independently visualized and interpreted imaging which showed no acute findings, but appendix not definitively identified  Additional history obtained:    Previous records obtained and reviewed urgent  care notes reviewed  Consultations Obtained:   I consulted general surgery Dr. Bobbye Morton and discussed lab and imaging findings-will see patient  Reevaluation:  After the interventions stated above, I reevaluated the patient and found patient with continued right lower quadrant abdominal pain  . Patient with right lower quadrant abdominal pain that is persisting.  CT did not definitely rule out appendicitis.  No adnexal masses and she is s/p hysterectomy.  Could be occult appendicitis.  Discussed with general surgery team for evaluation.  Final Clinical Impression(s) / ED Diagnoses Final diagnoses:  Right lower quadrant abdominal pain    Rx / DC Orders ED Discharge Orders    None       Ripley Fraise, MD 11/08/20 [phone]

## 2020-11-08 NOTE — Discharge Instructions (Signed)
[facility] LAPAROSCOPIC SURGERY: POST OP INSTRUCTIONS Always review your discharge instruction sheet given to you by the facility where your surgery was performed. IF YOU HAVE DISABILITY OR FAMILY LEAVE FORMS, YOU MUST BRING THEM TO THE OFFICE FOR PROCESSING.   DO NOT GIVE THEM TO YOUR DOCTOR.  PAIN CONTROL  1. First take acetaminophen (Tylenol) AND/or ibuprofen (Advil) to control your pain after surgery.  Follow directions on package.  Taking acetaminophen (Tylenol) and/or ibuprofen (Advil) regularly after surgery will help to control your pain and lower the amount of prescription pain medication you may need.  You should not take more than 3,000 mg (3 grams) of acetaminophen (Tylenol) in 24 hours.  You should not take ibuprofen (Advil), aleve, motrin, naprosyn or other NSAIDS if you have a history of stomach ulcers or chronic kidney disease.  2. A prescription for pain medication may be given to you upon discharge.  Take your pain medication as prescribed, if you still have uncontrolled pain after taking acetaminophen (Tylenol) or ibuprofen (Advil). 3. Use ice packs to help control pain. 4. If you need a refill on your pain medication, please contact your pharmacy.  They will contact our office to request authorization. Prescriptions will not be filled after 5pm or on week-ends.  HOME MEDICATIONS 5. Take your usually prescribed medications unless otherwise directed.  DIET 6. You should follow a light diet the first few days after arrival home.  Be sure to include lots of fluids daily. Avoid fatty, fried foods.   CONSTIPATION 7. It is common to experience some constipation after surgery and if you are taking pain medication.  Increasing fluid intake and taking a stool softener (such as Colace) will usually help or prevent this problem from occurring.  A mild laxative (Milk of Magnesia or Miralax) should be taken according to package instructions if there are no bowel  movements after 48 hours.  WOUND/INCISION CARE 8. Most patients will experience some swelling and bruising in the area of the incisions.  Ice packs will help.  Swelling and bruising can take several days to resolve.  9. Unless discharge instructions indicate otherwise, follow guidelines below  a. STERI-STRIPS - you may remove your outer bandages 48 hours after surgery, and you may shower at that time.  You have steri-strips (small skin tapes) in place directly over the incision.  These strips should be left on the skin for 7-10 days.   b. DERMABOND/SKIN GLUE - you may shower in 24 hours.  The glue will flake off over the next 2-3 weeks. 10. Any sutures or staples will be removed at the office during your follow-up visit.  ACTIVITIES 11. You may resume regular (light) daily activities beginning the next day--such as daily self-care, walking, climbing stairs--gradually increasing activities as tolerated.  You may have sexual intercourse when it is comfortable.  Refrain from any heavy lifting or straining until approved by your doctor. a. You may drive when you are no longer taking prescription pain medication, you can comfortably wear a seatbelt, and you can safely maneuver your car and apply brakes.  FOLLOW-UP 12. You should see your doctor in the office for a follow-up appointment approximately 2-3 weeks after your surgery.  You should have been given your post-op/follow-up appointment when your surgery was scheduled.  If you did not receive a post-op/follow-up appointment, make sure that you call for this appointment within a day or two after you arrive home to insure a convenient appointment time.     WHEN TO CALL YOUR DOCTOR: 1. Fever over 101.0 2. Inability to urinate 3. Continued bleeding from incision. 4. Increased pain, redness, or drainage from the incision. 5. Increasing abdominal pain  The clinic staff is available to answer your questions during regular business hours.  Please don't  hesitate to call and ask to speak to one of the nurses for clinical concerns.  If you have a medical emergency, go to the nearest emergency room or call 911.  A surgeon from [facility] is always on call at the hospital. [address], [address], [place], [place]  [address] ? [address] [address], , [place]   [address] [phone] ? [phone] ? FAX [phone] Web site: [email or web address]  .........   Managing Your Pain After Surgery Without Opioids    Thank you for participating in our program to help patients manage their pain after surgery without opioids. This is part of our effort to provide you with the best care possible, without exposing you or your family to the risk that opioids pose.  What pain can I expect after surgery? You can expect to have some pain after surgery. This is normal. The pain is typically worse the day after surgery, and quickly begins to get better. Many studies have found that many patients are able to manage their pain after surgery with Over-the-Counter (OTC) medications such as Tylenol and Motrin. If you have a condition that does not allow you to take Tylenol or Motrin, notify your surgical team.  How will I manage my pain? The best strategy for controlling your pain after surgery is around the clock pain control with Tylenol (acetaminophen) and Motrin (ibuprofen or Advil). Alternating these medications with each other allows you to maximize your pain control. In addition to Tylenol and Motrin, you can use heating pads or ice packs on your incisions to help reduce your pain.  How will I alternate your regular strength over-the-counter pain medication? You will take a dose of pain medication every three hours. ; Start by taking 650 mg of Tylenol (2 pills of 325 mg) ; 3 hours later take 600 mg of Motrin (3 pills of 200 mg) ; 3 hours after taking the Motrin take 650 mg of Tylenol ; 3 hours after that take 600 mg of  Motrin.   - 1 -  See example - if your first dose of Tylenol is at 12:00 PM   12:00 PM Tylenol 650 mg (2 pills of 325 mg)  3:00 PM Motrin 600 mg (3 pills of 200 mg)  6:00 PM Tylenol 650 mg (2 pills of 325 mg)  9:00 PM Motrin 600 mg (3 pills of 200 mg)  Continue alternating every 3 hours   We recommend that you follow this schedule around-the-clock for at least 3 days after surgery, or until you feel that it is no longer needed. Use the table on the last page of this handout to keep track of the medications you are taking. Important: Do not take more than 3000mg of Tylenol or 3200mg of Motrin in a 24-hour period. Do not take ibuprofen/Motrin if you have a history of bleeding stomach ulcers, severe kidney disease, &/or actively taking a blood thinner  What if I still have pain? If you have pain that is not controlled with the over-the-counter pain medications (Tylenol and Motrin or Advil) you might have what we call "breakthrough" pain. You will receive a prescription for a small amount of an opioid pain medication such as   Oxycodone, Tramadol, or Tylenol with Codeine. Use these opioid pills in the first 24 hours after surgery if you have breakthrough pain. Do not take more than 1 pill every 4-6 hours.  If you still have uncontrolled pain after using all opioid pills, don't hesitate to call our staff using the number provided. We will help make sure you are managing your pain in the best way possible, and if necessary, we can provide a prescription for additional pain medication.   Day 1    Time  Name of Medication Number of pills taken  Amount of Acetaminophen  Pain Level   Comments  AM PM       AM PM       AM PM       AM PM       AM PM       AM PM       AM PM       AM PM       Total Daily amount of Acetaminophen Do not take more than  3,000 mg per day      Day 2    Time  Name of Medication Number of pills taken  Amount of Acetaminophen  Pain Level   Comments  AM  PM       AM PM       AM PM       AM PM       AM PM       AM PM       AM PM       AM PM       Total Daily amount of Acetaminophen Do not take more than  3,000 mg per day      Day 3    Time  Name of Medication Number of pills taken  Amount of Acetaminophen  Pain Level   Comments  AM PM       AM PM       AM PM       AM PM          AM PM       AM PM       AM PM       AM PM       Total Daily amount of Acetaminophen Do not take more than  3,000 mg per day      Day 4    Time  Name of Medication Number of pills taken  Amount of Acetaminophen  Pain Level   Comments  AM PM       AM PM       AM PM       AM PM       AM PM       AM PM       AM PM       AM PM       Total Daily amount of Acetaminophen Do not take more than  3,000 mg per day      Day 5    Time  Name of Medication Number of pills taken  Amount of Acetaminophen  Pain Level   Comments  AM PM       AM PM       AM PM       AM PM       AM PM       AM PM       AM PM         AM PM       Total Daily amount of Acetaminophen Do not take more than  3,000 mg per day       Day 6    Time  Name of Medication Number of pills taken  Amount of Acetaminophen  Pain Level  Comments  AM PM       AM PM       AM PM       AM PM       AM PM       AM PM       AM PM       AM PM       Total Daily amount of Acetaminophen Do not take more than  3,000 mg per day      Day 7    Time  Name of Medication Number of pills taken  Amount of Acetaminophen  Pain Level   Comments  AM PM       AM PM       AM PM       AM PM       AM PM       AM PM       AM PM       AM PM       Total Daily amount of Acetaminophen Do not take more than  3,000 mg per day        For additional information about how and where to safely dispose of unused opioid medications - https://www.morepowerful[employer].org  Disclaimer: This document contains information and/or instructional materials adapted from [facility]  for the typical patient with your condition. It does not replace medical advice from your health care provider because your experience may differ from that of the typical patient. Talk to your health care provider if you have any questions about this document, your condition or your treatment plan. Adapted from [facility]  

## 2020-11-08 NOTE — Progress Notes (Signed)
I spoke with on call MD and let him know that Tracy Mejia is still in a lot of pain and not able to tolerate anything PO. He says it is ok to cancel discharge for tonight.

## 2020-11-08 NOTE — Anesthesia Procedure Notes (Signed)
Procedure Name: Intubation Performed by: Kyung Rudd, CRNA Pre-anesthesia Checklist: Patient identified, Emergency Drugs available, Suction available and Patient being monitored Patient Re-evaluated:Patient Re-evaluated prior to induction Oxygen Delivery Method: Circle system utilized Preoxygenation: Pre-oxygenation with 100% oxygen Induction Type: IV induction Laryngoscope Size: Mac and 3 Grade View: Grade I Tube type: Oral Tube size: 7.0 mm Number of attempts: 1 Airway Equipment and Method: Stylet Placement Confirmation: ETT inserted through vocal cords under direct vision,  positive ETCO2 and breath sounds checked- equal and bilateral Secured at: 22 cm Tube secured with: Tape Dental Injury: Teeth and Oropharynx as per pre-operative assessment

## 2020-11-08 NOTE — Progress Notes (Signed)
Patient of floor to OR.

## 2020-11-08 NOTE — Progress Notes (Signed)
Patient discharge med rec is not complete and there are no scripts for pain medication for the patient to go home with. I have paged the on call number but I have not heard back yet but I can't discharge the patient until this is all addressed.

## 2020-11-08 NOTE — H&P (Signed)
[facility] Admission Note  Tracy Mejia Aug 25, 1986  [id number].    Requesting MD: Gilford Raid, MD Chief Complaint/Reason for Consult: acute RLQ pain   HPI:  Tracy Mejia is a 35 y/o F with a PMH asthma who reports acute onset RLQ pain that started around 1530 yesterday, causing her to double over in pain. Pain in sharp, constant, non-radiating, worse with movement. Associated sxs include nausea but no vomiting. Also reports diarrhea on Sunday 2/13. Currently employed working in the [employer] Past surgeries include total abdominal hysterectomy and cholecystectomy. Pt is a current smoker.  ROS: as above  Review of Systems  All other systems reviewed and are negative.   Family History  Problem Relation Age of Onset  . Cancer Paternal Grandfather        melanoma, prostate cancer  . Diabetes Mother   . Hypertension Mother   . Cancer Mother        breast  . Stroke Father   . Factor V Leiden deficiency Father   . Heart disease Neg Hx     Past Medical History:  Diagnosis Date  . Asthma    exercise induced  . Heart murmur   . History of chicken pox   . Thyroid disease     Past Surgical History:  Procedure Laterality Date  . ABDOMINAL HYSTERECTOMY    . CHOLECYSTECTOMY  2010  . TUBAL LIGATION  2012  . WISDOM TOOTH EXTRACTION  2003    Social History:  reports that she has been smoking cigarettes. She has never used smokeless tobacco. She reports current alcohol use. No history on file for drug use.  Allergies:  Allergies  Allergen Reactions  . Cortisone     INJECTION ONLY. Pt reports swelling and discoloration at previous injection sites. Decreased pulse in foot.  . Morphine     Pt reports tension in back and difficulty moving.  . Liraglutide Nausea And Vomiting    (Not in a hospital admission)   Blood pressure 100/66, pulse 80, temperature 98 F (36.7 C), temperature source Oral, resp. rate 18, last menstrual period 03/24/2014, SpO2 97  %. Physical Exam: Constitutional: NAD; conversant; no deformities Eyes: Moist conjunctiva; no lid lag; anicteric; PERRL Neck: Trachea midline; no thyromegaly Lungs: Normal respiratory effort; no tactile fremitus CV: RRR; no palpable thrills; no pitting edema GI: Abd soft; TTP RLQ with rebound tenderness, no guarding.  + Rovsing sign, + obturator sign, +straight leg raise sign, no palpable hepatosplenomegaly MSK: symmetrical; no clubbing/cyanosis Psychiatric: Appropriate affect; alert and oriented x3 Lymphatic: No palpable cervical or axillary lymphadenopathy  Results for orders placed or performed during the hospital encounter of 11/07/20 (from the past 48 hour(s))  Lipase, blood     Status: None   Collection Time: 11/07/20  6:50 PM  Result Value Ref Range   Lipase 23 11 - 51 U/L    Comment: Performed at [facility], [place] [address], [place], [place] [address]  Comprehensive metabolic panel     Status: Abnormal   Collection Time: 11/07/20  6:50 PM  Result Value Ref Range   Sodium 137 135 - 145 mmol/L   Potassium 3.6 3.5 - 5.1 mmol/L   Chloride 101 98 - 111 mmol/L   CO2 26 22 - 32 mmol/L   Glucose, Bld 98 70 - 99 mg/dL    Comment: Glucose reference range applies only to samples taken after fasting for at least 8 hours.   BUN 14 6 - 20 mg/dL  Creatinine, Ser 0.75 0.44 - 1.00 mg/dL   Calcium 9.2 8.9 - 10.3 mg/dL   Total Protein 7.4 6.5 - 8.1 g/dL   Albumin 4.0 3.5 - 5.0 g/dL   AST 24 15 - 41 U/L   ALT 40 0 - 44 U/L   Alkaline Phosphatase 59 38 - 126 U/L   Total Bilirubin <0.1 (L) 0.3 - 1.2 mg/dL   GFR, Estimated >60 >60 mL/min    Comment: (NOTE) Calculated using the CKD-EPI Creatinine Equation (2021)    Anion gap 10 5 - 15    Comment: Performed at [facility] [address], [place], [place] [address]  CBC     Status: None   Collection Time: 11/07/20  6:50 PM  Result Value Ref Range   WBC 7.3 4.0 - 10.5 K/uL   RBC 4.55 3.87 - 5.11 MIL/uL   Hemoglobin 12.6  12.0 - 15.0 g/dL   HCT 38.7 36.0 - 46.0 %   MCV 85.1 80.0 - 100.0 fL   MCH 27.7 26.0 - 34.0 pg   MCHC 32.6 30.0 - 36.0 g/dL   RDW 13.2 11.5 - 15.5 %   Platelets 282 150 - 400 K/uL   nRBC 0.0 0.0 - 0.2 %    Comment: Performed at [facility], [place] [address] [place], [place] [address]  I-Stat beta hCG blood, ED     Status: None   Collection Time: 11/07/20  7:13 PM  Result Value Ref Range   I-stat hCG, quantitative <5.0 <5 mIU/mL   Comment 3            Comment:   GEST. AGE      CONC.  (mIU/mL)   <=1 WEEK        5 - 50     2 WEEKS       50 - 500     3 WEEKS       100 - 10,000     4 WEEKS     1,000 - 30,000        FEMALE AND NON-PREGNANT FEMALE:     LESS THAN 5 mIU/mL   Urinalysis, Routine w reflex microscopic Urine, Clean Catch     Status: Abnormal   Collection Time: 11/08/20  3:00 AM  Result Value Ref Range   Color, Urine YELLOW YELLOW   APPearance CLEAR CLEAR   Specific Gravity, Urine 1.014 1.005 - 1.030   pH 6.0 5.0 - 8.0   Glucose, UA NEGATIVE NEGATIVE mg/dL   Hgb urine dipstick NEGATIVE NEGATIVE   Bilirubin Urine NEGATIVE NEGATIVE   Ketones, ur NEGATIVE NEGATIVE mg/dL   Protein, ur NEGATIVE NEGATIVE mg/dL   Nitrite NEGATIVE NEGATIVE   Leukocytes,Ua TRACE (A) NEGATIVE   RBC / HPF 0-5 0 - 5 RBC/hpf   WBC, UA 0-5 0 - 5 WBC/hpf   Bacteria, UA NONE SEEN NONE SEEN   Squamous Epithelial / LPF 0-5 0 - 5   Mucus PRESENT     Comment: Performed at [facility], [place] [address] [place], [place] [address]   CT ABDOMEN PELVIS W CONTRAST  Addendum Date: 11/08/2020   ADDENDUM REPORT: 11/08/2020 07:52 ADDENDUM: Case reviewed with general surgery. On reformats, an appendix is seen emanating inferiorly from the cecum and is negative for appendicitis. Electronically Signed   By: Monte Fantasia M.D.   On: 11/08/2020 07:52   Result Date: 11/08/2020 CLINICAL DATA:  Acute abdominal pain.  Nausea. EXAM: CT ABDOMEN AND PELVIS WITH  CONTRAST TECHNIQUE: Multidetector CT imaging of the  abdomen and pelvis was performed using the standard protocol following bolus administration of intravenous contrast. CONTRAST:  118mL OMNIPAQUE IOHEXOL 300 MG/ML  SOLN COMPARISON:  CT 08/03/2017 FINDINGS: Lower chest: No focal airspace disease or pleural effusion. Mild elevation of right hemidiaphragm. Hepatobiliary: No focal liver abnormality is seen. Status post cholecystectomy. No biliary dilatation. Pancreas: No ductal dilatation or inflammation. Spleen: Normal in size without focal abnormality. Adrenals/Urinary Tract: Normal adrenal glands. No hydronephrosis or perinephric edema. Homogeneous renal enhancement. Urinary bladder is physiologically distended without wall thickening. Stomach/Bowel: Stomach is unremarkable. Normal positioning of the duodenum and ligament of Treitz. No small bowel obstruction or inflammation. Appendix not definitively visualized, no evidence of appendicitis. Small to moderate colonic stool burden without colonic wall thickening or inflammation. Vascular/Lymphatic: No significant vascular findings are present. No enlarged abdominal or pelvic lymph nodes. Reproductive: Status post hysterectomy. No adnexal masses. Other: No free air, free fluid, or intra-abdominal fluid collection. No abdominal wall hernia. Musculoskeletal: Lumbar spondylosis. There are no acute or suspicious osseous abnormalities. IMPRESSION: No acute abnormality or explanation for abdominal pain. Electronically Signed: By: Keith Rake M.D. On: 11/08/2020 03:58    Assessment/Plan Asthma - PRN albuterol  Acute right lower quadrant pain, possible appendicitis  - afebrile, VSS, WBC WNL - CT scan without evidence of acute appendicitis - on exam patient has signs of appendicitis - pain over Mcburney's point with rebound tenderness, positive Rovsing sign, positive obturator sign. Associated nausea. - recommend admission for observation, possible laparoscopic appendectomy later today if pain worsens or persists.   - PRN tylenol, dilaudid for breakthrough pain  Jill Alexanders, [facility] Please see Amion for pager number during day hours 7:00am-4:30pm 11/08/2020, 8:11 AM

## 2020-11-08 NOTE — ED Notes (Signed)
Call to [facility] to give report

## 2020-11-08 NOTE — Op Note (Signed)
11/08/2020  1:59 PM  PATIENT:  Tracy Mejia  35 y.o. female  PRE-OPERATIVE DIAGNOSIS:  Acute Appendicitis  POST-OPERATIVE DIAGNOSIS:  Acute Appendicitis  PROCEDURE:  Procedure(s): APPENDECTOMY LAPAROSCOPIC (N/A)  SURGEON:  Surgeon(s) and Role:    Ralene Ok, MD - Primary ASSISTANTS: none   ANESTHESIA:   local and general  EBL:  minimal   BLOOD ADMINISTERED:none  DRAINS: none   LOCAL MEDICATIONS USED:  BUPIVICAINE   SPECIMEN:  Source of Specimen:  appendix  DISPOSITION OF SPECIMEN:  PATHOLOGY  COUNTS:  YES  TOURNIQUET:  * No tourniquets in log *  DICTATION: .Dragon Dictation Complications: none  Counts: reported as correct x 2  Findings:  The patient had signs of acute appendicitis clinically.  Specimen: Appendix  Indications for procedure:  The patient is a 35 year old female with a history of periumbilical pain localized in the right lower quadrant patient had a CT scan which did not show inflammation of the appendix.  Her clinical exam was classic for appendicitis.  After a periofd of observation and con't pain we decided to proceed to OR for lap appy.  Details of the procedure:The patient was taken back to the operating room. The patient was placed in supine position with bilateral SCDs in place.  A foley catheter was place. The patient was prepped and draped in the usual sterile fashion.  After appropriate anitbiotics were confirmed, a time-out was confirmed and all facts were verified.    A pneumoperitoneum of 14 mmHg was obtained via a Veress needle technique in the left lower quadrant quadrant.  A 5 mm trocar and 5 mm camera then placed intra-abdominally there is no injury to any intra-abdominal organs a 10 mm infraumbilical port was placed and direct visualization as was a 5 mm port in the suprapubic area.   The appendix was identified and seen to be non-perforated and minimally inflamed.  The appendix was cleaned down to the appendiceal base. The  mesoappendix was then incised and the appendiceal artery was cauterized.  The the appendiceal base was clean.  A gold hemoclip was placed proximallyx2 and one distally and the appendix was transected between these 2. A retrieval bag was then placed into the abdomen and the specimen placed in the bag. The appendiceal stump was cauterized. We evacuate the fluid from the pelvis until the effluent was clear.  The appendix and retrieval  bag was then retrieved via the supraumbilical port. #1 Vicryl was used to reapproximate the fascia at the umbilical port site x2. The skin was reapproximated all port sites 3-0 Monocryl subcuticular fashion. The skin was dressed with Dermabond.  The patient had the foley removed. The patient was awakened from general anesthesia was taken to recovery room in stable condition.      PLAN OF CARE: Discharge to home after PACU  PATIENT DISPOSITION:  PACU - hemodynamically stable.   Delay start of Pharmacological VTE agent (>24hrs) due to surgical blood loss or risk of bleeding: not applicable

## 2020-11-08 NOTE — Anesthesia Postprocedure Evaluation (Signed)
Anesthesia Post Note  Patient: Tracy Mejia  Procedure(s) Performed: APPENDECTOMY LAPAROSCOPIC (N/A Abdomen)     Patient location during evaluation: PACU Anesthesia Type: General Level of consciousness: awake and alert and oriented Pain management: pain level controlled Vital Signs Assessment: post-procedure vital signs reviewed and stable Respiratory status: spontaneous breathing, nonlabored ventilation and respiratory function stable Cardiovascular status: blood pressure returned to baseline Postop Assessment: no apparent nausea or vomiting Anesthetic complications: no   No complications documented.  Last Vitals:  Vitals:   11/08/20 1451 11/08/20 1506  BP:  105/79  Pulse: 78 71  Resp: 14 16  Temp: (!) 36.2 C 36.9 C  SpO2: 97% 96%    Last Pain:  Vitals:   11/08/20 1506  TempSrc: Oral  PainSc:                  Brennan Bailey

## 2020-11-08 NOTE — Transfer of Care (Signed)
Immediate Anesthesia Transfer of Care Note  Patient: Tracy Mejia  Procedure(s) Performed: APPENDECTOMY LAPAROSCOPIC (N/A Abdomen)  Patient Location: PACU  Anesthesia Type:General  Level of Consciousness: drowsy  Airway & Oxygen Therapy: Patient Spontanous Breathing  Post-op Assessment: Report given to RN and Post -op Vital signs reviewed and stable  Post vital signs: Reviewed and stable  Last Vitals:  Vitals Value Taken Time  BP 118/94 11/08/20 1414  Temp    Pulse 82 11/08/20 1416  Resp 11 11/08/20 1416  SpO2 97 % 11/08/20 1416  Vitals shown include unvalidated device data.  Last Pain:  Vitals:   11/08/20 1150  TempSrc: Oral  PainSc:          Complications: No complications documented.

## 2020-11-08 NOTE — ED Notes (Signed)
Call to phlebotomy for [facility]

## 2020-11-08 NOTE — Progress Notes (Signed)
Patient arrived to room 5C19. Patient complaining of headache, abdominal pain and nausea. These will be addressed. Patient oriented to room and call bell in reach.

## 2020-11-08 NOTE — Progress Notes (Signed)
Patient back to room from PACU. VS stable and patient resting with eyes closed. Call bell in reach.

## 2020-11-08 NOTE — Anesthesia Preprocedure Evaluation (Addendum)
Anesthesia Evaluation  Patient identified by MRN, date of birth, ID band Patient awake    Reviewed: Allergy & Precautions, NPO status , Patient's Chart, lab work & pertinent test results  History of Anesthesia Complications Negative for: history of anesthetic complications  Airway Mallampati: II  TM Distance: >3 FB Neck ROM: Full    Dental  (+) Poor Dentition   Pulmonary asthma , Sleep apnea: suggestive features. , former smoker,    Pulmonary exam normal        Cardiovascular Exercise Tolerance: Good negative cardio ROS Normal cardiovascular exam     Neuro/Psych  Headaches, PSYCHIATRIC DISORDERS Anxiety Depression    GI/Hepatic Neg liver ROS, Acute appendicitis   Endo/Other  negative endocrine ROS  Renal/GU negative Renal ROS     Musculoskeletal negative musculoskeletal ROS (+)   Abdominal   Peds  Hematology negative hematology ROS (+)   Anesthesia Other Findings   Reproductive/Obstetrics negative OB ROS                           Anesthesia Physical Anesthesia Plan  ASA: II and emergent  Anesthesia Plan: General   Post-op Pain Management:    Induction: Intravenous and Rapid sequence  PONV Risk Score and Plan: 3 and Scopolamine patch - Pre-op, Ondansetron, Midazolam, Treatment may vary due to age or medical condition and Dexamethasone  Airway Management Planned: Oral ETT  Additional Equipment: None  Intra-op Plan:   Post-operative Plan: Extubation in OR  Informed Consent: I have reviewed the patients History and Physical, chart, labs and discussed the procedure including the risks, benefits and alternatives for the proposed anesthesia with the patient or authorized representative who has indicated his/her understanding and acceptance.     Dental advisory given  Plan Discussed with: CRNA and Anesthesiologist  Anesthesia Plan Comments:        Anesthesia Quick  Evaluation

## 2020-11-09 ENCOUNTER — Encounter (HOSPITAL_COMMUNITY): Payer: Self-pay | Admitting: General Surgery

## 2020-11-09 DIAGNOSIS — E039 Hypothyroidism, unspecified: Secondary | ICD-10-CM | POA: Diagnosis not present

## 2020-11-09 DIAGNOSIS — K3580 Unspecified acute appendicitis: Secondary | ICD-10-CM | POA: Diagnosis not present

## 2020-11-09 DIAGNOSIS — Z79899 Other long term (current) drug therapy: Secondary | ICD-10-CM | POA: Diagnosis not present

## 2020-11-09 DIAGNOSIS — Z20822 Contact with and (suspected) exposure to covid-19: Secondary | ICD-10-CM | POA: Diagnosis not present

## 2020-11-09 DIAGNOSIS — F17210 Nicotine dependence, cigarettes, uncomplicated: Secondary | ICD-10-CM | POA: Diagnosis not present

## 2020-11-09 DIAGNOSIS — R1031 Right lower quadrant pain: Secondary | ICD-10-CM | POA: Diagnosis not present

## 2020-11-09 DIAGNOSIS — J45909 Unspecified asthma, uncomplicated: Secondary | ICD-10-CM | POA: Diagnosis not present

## 2020-11-09 MED ORDER — OXYCODONE HCL 5 MG PO TABS: 5.0000 mg | ORAL_TABLET | Freq: Four times a day (QID) | ORAL | 0 refills | Status: DC | PRN

## 2020-11-09 MED ORDER — OXYCODONE HCL 5 MG PO TABS: 5.0000 mg | ORAL_TABLET | Freq: Four times a day (QID) | ORAL | 0 refills | Status: AC | PRN

## 2020-11-09 NOTE — Progress Notes (Signed)
Discharge instructions reviewed and given to pt, pt father at bedside. Pt acknowledge understanding.

## 2020-11-10 LAB — SURGICAL PATHOLOGY

## 2020-11-10 NOTE — Discharge Summary (Addendum)
[facility] Discharge Summary   Patient ID: Tracy Mejia MRN: [id number] DOB/AGE: [birth year] 35 y.o.  Admit date: 11/08/2020 Discharge date: 11/09/2020  Discharge Diagnosis Patient Active Problem List   Diagnosis Date Noted  . Acute right lower quadrant pain 11/08/2020  . Current moderate episode of major depressive disorder without prior episode ([place]) 01/28/2018  . Hepatic steatosis 12/09/2017  . Irritable bowel syndrome with diarrhea 12/09/2017  . Chronic tension-type headache, not intractable 10/05/2015  . Excessive daytime sleepiness 10/05/2015  . Left knee injury 10/05/2015  . Snoring 10/05/2015  . Uterine prolapse 01/05/2015  . SUI (stress urinary incontinence, female) 12/29/2014  . Pharyngoesophageal dysphagia 10/04/2014  . Inflammation of joint of right knee 08/31/2014  . Anxiety state, unspecified 04/13/2014  . Depression 10/15/2012  . Headache 10/15/2012  . THYROIDITIS 11/12/2008  . URINALYSIS, ABNORMAL 11/12/2008  . Hyperuricuria 11/12/2008  . History of hypothyroidism 12/18/2007  . History of endocrine, metabolic or immunity disorder 12/18/2007  . PLANTAR WART 10/31/2007  . Low back pain 10/31/2007  . DISORDER, ADJUSTMENT W/DEPRESSED MOOD 07/16/2007  . ADHD 07/16/2007  . Attention-deficit hyperactivity disorder, unspecified type 07/16/2007  . Acute actinic otitis externa 07/08/2007  . Generalized anxiety disorder 02/12/2005  . Mild intermittent asthma 02/12/2005    Consultants None   Imaging:  EXAM: CT ABDOMEN AND PELVIS WITH CONTRAST  TECHNIQUE: Multidetector CT imaging of the abdomen and pelvis was performed using the standard protocol following bolus administration of intravenous contrast.  CONTRAST:  163mL OMNIPAQUE IOHEXOL 300 MG/ML  SOLN  COMPARISON:  CT 08/03/2017  FINDINGS: Lower chest: No focal airspace disease or pleural effusion. Mild elevation of right hemidiaphragm.  Hepatobiliary: No focal liver  abnormality is seen. Status post cholecystectomy. No biliary dilatation.  Pancreas: No ductal dilatation or inflammation.  Spleen: Normal in size without focal abnormality.  Adrenals/Urinary Tract: Normal adrenal glands. No hydronephrosis or perinephric edema. Homogeneous renal enhancement. Urinary bladder is physiologically distended without wall thickening.  Stomach/Bowel: Stomach is unremarkable. Normal positioning of the duodenum and ligament of Treitz. No small bowel obstruction or inflammation. Appendix not definitively visualized, no evidence of appendicitis. Small to moderate colonic stool burden without colonic wall thickening or inflammation.  Vascular/Lymphatic: No significant vascular findings are present. No enlarged abdominal or pelvic lymph nodes.  Reproductive: Status post hysterectomy. No adnexal masses.  Other: No free air, free fluid, or intra-abdominal fluid collection. No abdominal wall hernia.  Musculoskeletal: Lumbar spondylosis. There are no acute or suspicious osseous abnormalities.  IMPRESSION: No acute abnormality or explanation for abdominal pain.  Procedures Dr. Ralene Ok (11/08/2020) - Laparoscopic Appendectomy  Hospital Course:  35 y/o F who [facility] to [name] with <24 hours acute onset RLQ abdominal pain.  Lab studies and CTabdomen (above) without significant abnormality.  Patient history and physical exam were consistent with acute appendicitis - positive rovsing, obturator signs. Patient was admitted for observation and after a few hours her pain was not improving. She was taken to the OR for laparoscopic appendectomy.  Tolerated procedure well and was transferred to the floor.  Diet was advanced as tolerated.  On POD#1, the patient was voiding well, tolerating diet, ambulating well, pain well controlled, vital signs stable, incisions c/d/i and felt stable for discharge home.  Patient will follow up in our office as below and knows to  call with questions or concerns.     surgical pathology - FINAL MICROSCOPIC DIAGNOSIS:   A. 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  -  Tumor invades submucosa.  - Resection margin is negative.  - No definitive acute appendicitis.  - See oncology table.   Physical Exam: General:  Alert, NAD, pleasant, comfortable Abd:  Soft, ND, mild tenderness, incisions C/D/I  Allergies as of 11/09/2020      Reactions   Cortisone    INJECTION ONLY. Pt reports swelling and discoloration at previous injection sites. Decreased pulse in foot.   Morphine    Pt reports tension in back and difficulty moving.   Liraglutide Nausea And Vomiting      Medication List    TAKE these medications   albuterol 108 (90 Base) MCG/ACT inhaler Commonly known as: VENTOLIN HFA Inhale 2 puffs into the lungs every 6 (six) hours as needed.   aspirin-acetaminophen-caffeine 250-250-65 MG tablet Commonly known as: EXCEDRIN MIGRAINE Take by mouth.   gabapentin 100 MG capsule Commonly known as: NEURONTIN Take 1 capsule (100 mg total) by mouth 3 (three) times daily.   ibuprofen 800 MG tablet Commonly known as: ADVIL Take by mouth.   oxyCODONE 5 MG immediate release tablet Commonly known as: Oxy IR/ROXICODONE Take 1 tablet (5 mg total) by mouth every 6 (six) hours as needed for moderate pain (pain not releived by tylenol and ibuprofen).        Follow-up [facility] Follow up.   Specialty: General Surgery Why: our office is scheduling you for post-operative follow up. please call to confirm appointment date/time. please arrive 30 minutes early. Contact information: [address] [place] [phone]              Signed: [name], [facility] 11/10/2020, 4:08 PM

## 2020-11-13 LAB — CULTURE, BLOOD (ROUTINE X 2)
Culture: NO GROWTH
Culture: NO GROWTH
Special Requests: ADEQUATE
Special Requests: ADEQUATE

## 2020-11-25 DIAGNOSIS — R748 Abnormal levels of other serum enzymes: Secondary | ICD-10-CM | POA: Diagnosis not present

## 2020-11-25 DIAGNOSIS — R5383 Other fatigue: Secondary | ICD-10-CM | POA: Diagnosis not present

## 2020-11-25 DIAGNOSIS — F411 Generalized anxiety disorder: Secondary | ICD-10-CM | POA: Diagnosis not present

## 2020-12-15 ENCOUNTER — Other Ambulatory Visit (HOSPITAL_BASED_OUTPATIENT_CLINIC_OR_DEPARTMENT_OTHER): Payer: Self-pay

## 2020-12-16 DIAGNOSIS — Z9889 Other specified postprocedural states: Secondary | ICD-10-CM | POA: Diagnosis not present

## 2020-12-16 DIAGNOSIS — K760 Fatty (change of) liver, not elsewhere classified: Secondary | ICD-10-CM | POA: Diagnosis not present

## 2020-12-16 DIAGNOSIS — R1030 Lower abdominal pain, unspecified: Secondary | ICD-10-CM | POA: Diagnosis not present

## 2020-12-16 DIAGNOSIS — R197 Diarrhea, unspecified: Secondary | ICD-10-CM | POA: Diagnosis not present

## 2020-12-16 DIAGNOSIS — Z9049 Acquired absence of other specified parts of digestive tract: Secondary | ICD-10-CM | POA: Diagnosis not present

## 2020-12-16 DIAGNOSIS — Z8719 Personal history of other diseases of the digestive system: Secondary | ICD-10-CM | POA: Diagnosis not present

## 2020-12-16 DIAGNOSIS — K529 Noninfective gastroenteritis and colitis, unspecified: Secondary | ICD-10-CM | POA: Diagnosis not present

## 2020-12-16 DIAGNOSIS — R1084 Generalized abdominal pain: Secondary | ICD-10-CM | POA: Diagnosis not present

## 2020-12-22 DIAGNOSIS — M4726 Other spondylosis with radiculopathy, lumbar region: Secondary | ICD-10-CM | POA: Diagnosis not present

## 2020-12-22 DIAGNOSIS — M545 Low back pain, unspecified: Secondary | ICD-10-CM | POA: Diagnosis not present

## 2020-12-23 DIAGNOSIS — D3A00 Benign carcinoid tumor of unspecified site: Secondary | ICD-10-CM | POA: Diagnosis not present

## 2021-01-05 ENCOUNTER — Other Ambulatory Visit (HOSPITAL_COMMUNITY): Payer: Self-pay

## 2021-01-05 DIAGNOSIS — R7989 Other specified abnormal findings of blood chemistry: Secondary | ICD-10-CM | POA: Diagnosis not present

## 2021-01-05 DIAGNOSIS — K760 Fatty (change of) liver, not elsewhere classified: Secondary | ICD-10-CM | POA: Diagnosis not present

## 2021-01-05 DIAGNOSIS — F411 Generalized anxiety disorder: Secondary | ICD-10-CM | POA: Diagnosis not present

## 2021-01-05 DIAGNOSIS — E669 Obesity, unspecified: Secondary | ICD-10-CM | POA: Diagnosis not present

## 2021-01-05 MED ORDER — HYDROXYZINE HCL 25 MG PO TABS
25.0000 mg | ORAL_TABLET | Freq: Three times a day (TID) | ORAL | 2 refills | Status: AC | PRN
  Filled 2021-01-05: qty 90, 30d supply, fill #0

## 2021-01-06 ENCOUNTER — Other Ambulatory Visit (HOSPITAL_COMMUNITY): Payer: Self-pay

## 2021-01-10 DIAGNOSIS — Z79899 Other long term (current) drug therapy: Secondary | ICD-10-CM | POA: Diagnosis not present

## 2021-01-10 DIAGNOSIS — Z9049 Acquired absence of other specified parts of digestive tract: Secondary | ICD-10-CM | POA: Diagnosis not present

## 2021-01-10 DIAGNOSIS — R1010 Upper abdominal pain, unspecified: Secondary | ICD-10-CM | POA: Diagnosis not present

## 2021-01-10 DIAGNOSIS — Z91048 Other nonmedicinal substance allergy status: Secondary | ICD-10-CM | POA: Diagnosis not present

## 2021-01-10 DIAGNOSIS — Z87891 Personal history of nicotine dependence: Secondary | ICD-10-CM | POA: Diagnosis not present

## 2021-01-10 DIAGNOSIS — Z885 Allergy status to narcotic agent status: Secondary | ICD-10-CM | POA: Diagnosis not present

## 2021-01-10 DIAGNOSIS — Z888 Allergy status to other drugs, medicaments and biological substances status: Secondary | ICD-10-CM | POA: Diagnosis not present

## 2021-01-10 DIAGNOSIS — K529 Noninfective gastroenteritis and colitis, unspecified: Secondary | ICD-10-CM | POA: Diagnosis not present

## 2021-01-26 DIAGNOSIS — M4726 Other spondylosis with radiculopathy, lumbar region: Secondary | ICD-10-CM | POA: Diagnosis not present

## 2021-01-26 DIAGNOSIS — M545 Low back pain, unspecified: Secondary | ICD-10-CM | POA: Diagnosis not present

## 2021-02-22 ENCOUNTER — Other Ambulatory Visit: Payer: Self-pay

## 2021-02-22 ENCOUNTER — Ambulatory Visit (HOSPITAL_COMMUNITY)
Admission: EM | Admit: 2021-02-22 | Discharge: 2021-02-22 | Disposition: A | Discharge status: Home / Self Care | Payer: 59 | Attending: Emergency Medicine | Admitting: Emergency Medicine

## 2021-02-22 ENCOUNTER — Encounter (HOSPITAL_COMMUNITY): Payer: Self-pay

## 2021-02-22 DIAGNOSIS — Z87891 Personal history of nicotine dependence: Secondary | ICD-10-CM | POA: Insufficient documentation

## 2021-02-22 DIAGNOSIS — J029 Acute pharyngitis, unspecified: Secondary | ICD-10-CM | POA: Insufficient documentation

## 2021-02-22 DIAGNOSIS — R110 Nausea: Secondary | ICD-10-CM | POA: Insufficient documentation

## 2021-02-22 DIAGNOSIS — Z888 Allergy status to other drugs, medicaments and biological substances status: Secondary | ICD-10-CM | POA: Insufficient documentation

## 2021-02-22 DIAGNOSIS — Z20822 Contact with and (suspected) exposure to covid-19: Secondary | ICD-10-CM | POA: Diagnosis not present

## 2021-02-22 DIAGNOSIS — B349 Viral infection, unspecified: Secondary | ICD-10-CM | POA: Diagnosis not present

## 2021-02-22 DIAGNOSIS — Z885 Allergy status to narcotic agent status: Secondary | ICD-10-CM | POA: Insufficient documentation

## 2021-02-22 DIAGNOSIS — R519 Headache, unspecified: Secondary | ICD-10-CM | POA: Diagnosis not present

## 2021-02-22 DIAGNOSIS — Z79899 Other long term (current) drug therapy: Secondary | ICD-10-CM | POA: Diagnosis not present

## 2021-02-22 LAB — SARS CORONAVIRUS 2 (TAT 6-24 HRS): SARS Coronavirus 2: NEGATIVE

## 2021-02-22 LAB — POC INFLUENZA A AND B ANTIGEN (URGENT CARE ONLY)
INFLUENZA A ANTIGEN, POC: NEGATIVE
INFLUENZA B ANTIGEN, POC: NEGATIVE

## 2021-02-22 MED ORDER — ONDANSETRON HCL 4 MG PO TABS: 4.0000 mg | ORAL_TABLET | Freq: Four times a day (QID) | ORAL | 0 refills | Status: AC

## 2021-02-22 MED ORDER — ONDANSETRON 4 MG PO TBDP
4.0000 mg | ORAL_TABLET | Freq: Once | ORAL | Status: AC
  Administered 2021-02-22: 4 mg via ORAL

## 2021-02-22 MED ORDER — ONDANSETRON 4 MG PO TBDP
ORAL_TABLET | ORAL | Status: AC
  Filled 2021-02-22: qty 1

## 2021-02-22 NOTE — ED Triage Notes (Signed)
Pt c/o chest pressure, headache and sore throat X 2 days. Pt states she has been taking medicine and states it has not helped relieve sxs.

## 2021-02-22 NOTE — Discharge Instructions (Addendum)
Can use zofran every 6 hours as needed for nausea  Can continue use of sudafed as needed for congestion  Can attempt salt water gargles,, hot or cold liquids, over the counter Chloraseptic spray, hard candies for comfort  Can use ibuprofen 400 mg 4 Tylenol 650 mg every 6 hours as needed for headache or if fevers begin  COVID test pending 24 hours, you will be called if positive  Flu test pending, you will be called if positive

## 2021-02-22 NOTE — ED Provider Notes (Signed)
[place]    CSN: [id number] Arrival date & time: 02/22/21  0806      History   Chief Complaint Chief Complaint  Patient presents with  . Headache  . Sore Throat    HPI Tracy Mejia is a 35 y.o. female.   Patient presents with headache over crown of head, sinus pressure, sore throat, nausea, nasal congestion, fatigue and generally feeling not well beginning yesterday, worsened overnight.  Denies fevers, chills, body aches, ear pain today, abdominal pain, diarrhea.  Attempted use of over-the-counter medication with no relief  Past Medical History:  Diagnosis Date  . Asthma    exercise induced  . Heart murmur   . History of chicken pox   . Thyroid disease     Patient Active Problem List   Diagnosis Date Noted  . Acute right lower quadrant pain 11/08/2020  . Current moderate episode of major depressive disorder without prior episode ([place]) 01/28/2018  . Hepatic steatosis 12/09/2017  . Irritable bowel syndrome with diarrhea 12/09/2017  . Chronic tension-type headache, not intractable 10/05/2015  . Excessive daytime sleepiness 10/05/2015  . Left knee injury 10/05/2015  . Snoring 10/05/2015  . Uterine prolapse 01/05/2015  . SUI (stress urinary incontinence, female) 12/29/2014  . Pharyngoesophageal dysphagia 10/04/2014  . Inflammation of joint of right knee 08/31/2014  . Anxiety state, unspecified 04/13/2014  . Depression 10/15/2012  . Headache 10/15/2012  . THYROIDITIS 11/12/2008  . URINALYSIS, ABNORMAL 11/12/2008  . Hyperuricuria 11/12/2008  . History of hypothyroidism 12/18/2007  . History of endocrine, metabolic or immunity disorder 12/18/2007  . PLANTAR WART 10/31/2007  . Low back pain 10/31/2007  . DISORDER, ADJUSTMENT W/DEPRESSED MOOD 07/16/2007  . ADHD 07/16/2007  . Attention-deficit hyperactivity disorder, unspecified type 07/16/2007  . Acute actinic otitis externa 07/08/2007  . Generalized anxiety disorder 02/12/2005  . Mild  intermittent asthma 02/12/2005    Past Surgical History:  Procedure Laterality Date  . ABDOMINAL HYSTERECTOMY    . CHOLECYSTECTOMY  2010  . LAPAROSCOPIC APPENDECTOMY N/A 11/08/2020   Procedure: APPENDECTOMY LAPAROSCOPIC;  Surgeon: Ralene Ok, MD;  Location: [facility];  Service: General;  Laterality: N/A;  . TUBAL LIGATION  2012  . WISDOM TOOTH EXTRACTION  2003    OB History   No obstetric history on file.      Home Medications    Prior to Admission medications   Medication Sig Start Date End Date Taking? Authorizing Provider  ondansetron (ZOFRAN) 4 MG tablet Take 1 tablet (4 mg total) by mouth every 6 (six) hours. 02/22/21  Yes Domanique Huesman R, NP  albuterol (PROVENTIL HFA;VENTOLIN HFA) 108 (90 BASE) MCG/ACT inhaler Inhale 2 puffs into the lungs every 6 (six) hours as needed. 12/08/12   Debbrah Alar, NP  aspirin-acetaminophen-caffeine (EXCEDRIN MIGRAINE) [phone] MG tablet Take by mouth.    [provider]  dicyclomine (BENTYL) 10 MG capsule TAKE 1 CAPSULE BY MOUTH 4 TIMES DAILY BEFORE MEALS AND NIGHTLY (AT BEDTIME) AS NEEDED. 11/03/20 11/03/21  Lennox Solders, PA-C  gabapentin (NEURONTIN) 100 MG capsule Take 1 capsule (100 mg total) by mouth 3 (three) times daily. 11/21/18   Edrick Kins, DPM  hydrOXYzine (ATARAX/VISTARIL) 25 MG tablet Take 1 tablet (25 mg total) by mouth 3 (three) times daily as needed for Anxiety. 01/05/21     ibuprofen (ADVIL,MOTRIN) 800 MG tablet Take by mouth. 05/13/15   [provider]  oxyCODONE (OXY IR/ROXICODONE) 5 MG immediate release tablet Take 1 tablet (5 mg total) by mouth every 6 (  six) hours as needed for moderate pain (pain not releived by tylenol and ibuprofen). 11/09/20   Jill Alexanders, PA-C  colchicine 0.6 MG tablet Take 1 tablet (0.6 mg total) by mouth daily. 07/23/18 11/07/20  Edrick Kins, DPM  DULoxetine (CYMBALTA) 30 MG capsule Take one capsule daily for one week then take 2 capsules daily 08/01/16 11/07/20   [provider]  ferrous fumarate (HEMOCYTE - 106 MG FE) 325 (106 FE) MG TABS Take 1 tablet by mouth.  11/07/20  [provider]  fluticasone (FLONASE) 50 MCG/ACT nasal spray 2 sprays by Each Nare route daily. 07/11/16 11/07/20  [provider]  hyoscyamine (LEVSIN SL) 0.125 MG SL tablet Place under the tongue. 12/27/16 11/07/20  [provider]  sertraline (ZOLOFT) 50 MG tablet 1/2 tab by mouth once daily for 1 week, then increase to a full tab once daily on week two. Patient not taking: Reported on 11/07/2020 04/13/14 11/07/20  Debbrah Alar, NP  topiramate (TOPAMAX) 25 MG tablet Titrate 1 tab QHS 1st wk, 2 tabs QHS 2nd wk, 3 tabs QHS 3rd wk, 4 tabs QHS 4th wk 12/27/16 11/07/20  [provider]  traZODone (DESYREL) 50 MG tablet Take 1/2 tablet at bedtime for one week then take one tablet at bedtime 08/01/16 11/07/20  [provider]    Family History Family History  Problem Relation Age of Onset  . Cancer Paternal Grandfather        melanoma, prostate cancer  . Diabetes Mother   . Hypertension Mother   . Cancer Mother        breast  . Stroke Father   . Factor V Leiden deficiency Father   . Heart disease Neg Hx     Social History Social History   Tobacco Use  . Smoking status: Former Smoker    Types: Cigarettes    Quit date: 10/08/2020    Years since quitting: 0.3  . Smokeless tobacco: Never Used  . Tobacco comment: 4-5 (1 pack weekly) cigarettes daily  Vaping Use  . Vaping Use: Never used  Substance Use Topics  . Alcohol use: Yes    Comment: occasional  . Drug use: Never     Allergies   Cortisone, Morphine, and Liraglutide   Review of Systems Review of Systems  Defer to HPI    Physical Exam Triage Vital Signs ED Triage Vitals  Enc Vitals Group     BP 02/22/21 0821 110/68     Pulse Rate 02/22/21 0820 (!) 103     Resp 02/22/21 0820 17     Temp 02/22/21 0820 98.6 F (37 C)     Temp Source 02/22/21 0820 Oral      SpO2 02/22/21 0820 99 %     Weight --      Height --      Head Circumference --      Peak Flow --      Pain Score 02/22/21 0818 4     Pain Loc --      Pain Edu? --      Excl. in [place]? --    No data found.  Updated Vital Signs BP 110/68   Pulse (!) 103   Temp 98.6 F (37 C) (Oral)   Resp 17   LMP 03/24/2014   SpO2 99%   Visual Acuity Right Eye Distance:   Left Eye Distance:   Bilateral Distance:    Right Eye Near:   Left Eye Near:    Bilateral  Near:     Physical Exam Constitutional:      Appearance: She is well-developed and normal weight. She is ill-appearing.  HENT:     Head: Normocephalic.     Right Ear: Tympanic membrane, ear canal and external ear normal.     Left Ear: Tympanic membrane, ear canal and external ear normal.     Nose: Congestion and rhinorrhea present.     Mouth/Throat:     Mouth: Mucous membranes are moist.     Pharynx: Posterior oropharyngeal erythema present.  Eyes:     Extraocular Movements: Extraocular movements intact.  Cardiovascular:     Rate and Rhythm: Normal rate and regular rhythm.     Pulses: Normal pulses.     Heart sounds: Normal heart sounds.  Pulmonary:     Effort: Pulmonary effort is normal.     Breath sounds: Normal breath sounds.  Abdominal:     General: Abdomen is flat. Bowel sounds are normal.     Palpations: Abdomen is soft.  Musculoskeletal:        General: Normal range of motion.     Cervical back: Normal range of motion.  Skin:    General: Skin is warm and dry.  Neurological:     Mental Status: She is alert and oriented to person, place, and time. Mental status is at baseline.  Psychiatric:        Mood and Affect: Mood normal.        Behavior: Behavior normal.        Thought Content: Thought content normal.        Judgment: Judgment normal.      UC Treatments / Results  Labs (all labs ordered are listed, but only abnormal results are displayed) Labs Reviewed  SARS CORONAVIRUS 2 (TAT 6-24 HRS)  POC  INFLUENZA A AND B ANTIGEN (URGENT CARE ONLY)    EKG   Radiology No results found.  Procedures Procedures (including critical care time)  Medications Ordered in UC Medications  ondansetron (ZOFRAN-ODT) disintegrating tablet 4 mg (4 mg Oral Given 02/22/21 0836)    Initial Impression / Assessment and Plan / UC Course  I have reviewed the triage vital signs and the nursing notes.  Pertinent labs & imaging results that were available during my care of the patient were reviewed by me and considered in my medical decision making (see chart for details).  Viral Illness  1. zofran 4 mg odt now 2. zofran 4 mg every 6 hours as needed 3. Flu negative 4. covid pending  5. otc medication for symptom management  Final Clinical Impressions(s) / UC Diagnoses   Final diagnoses:  Viral illness     Discharge Instructions     Can use zofran every 6 hours as needed for nausea  Can continue use of sudafed as needed for congestion  Can attempt salt water gargles,, hot or cold liquids, over the counter Chloraseptic spray, hard candies for comfort  Can use ibuprofen 400 mg 4 Tylenol 650 mg every 6 hours as needed for headache or if fevers begin  COVID test pending 24 hours, you will be called if positive  Flu test pending, you will be called if positive   ED Prescriptions    Medication Sig Dispense Auth. Provider   ondansetron (ZOFRAN) 4 MG tablet Take 1 tablet (4 mg total) by mouth every 6 (six) hours. 12 tablet Yarrow Linhart, Leitha Schuller, NP     PDMP not reviewed this encounter.   Hans Eden, NP  02/22/21 1138  

## 2021-02-23 ENCOUNTER — Ambulatory Visit (INDEPENDENT_AMBULATORY_CARE_PROVIDER_SITE_OTHER): Payer: 59 | Admitting: Podiatry

## 2021-02-23 ENCOUNTER — Ambulatory Visit (INDEPENDENT_AMBULATORY_CARE_PROVIDER_SITE_OTHER): Payer: 59

## 2021-02-23 DIAGNOSIS — M7750 Other enthesopathy of unspecified foot: Secondary | ICD-10-CM

## 2021-02-23 DIAGNOSIS — M7752 Other enthesopathy of left foot: Secondary | ICD-10-CM

## 2021-02-23 DIAGNOSIS — G629 Polyneuropathy, unspecified: Secondary | ICD-10-CM | POA: Diagnosis not present

## 2021-02-23 MED ORDER — GABAPENTIN 100 MG PO CAPS: 100.0000 mg | ORAL_CAPSULE | Freq: Three times a day (TID) | ORAL | 3 refills | Status: AC

## 2021-02-24 DIAGNOSIS — K760 Fatty (change of) liver, not elsewhere classified: Secondary | ICD-10-CM | POA: Diagnosis not present

## 2021-02-24 DIAGNOSIS — R197 Diarrhea, unspecified: Secondary | ICD-10-CM | POA: Diagnosis not present

## 2021-02-24 DIAGNOSIS — R1084 Generalized abdominal pain: Secondary | ICD-10-CM | POA: Diagnosis not present

## 2021-02-28 NOTE — Progress Notes (Signed)
Subjective:   Patient ID: Tracy Mejia, female   DOB: 35 y.o.   MRN: [id number]   HPI Patient presents stating she is somewhat improved from previous still having mild to moderate discomfort   ROS      Objective:  Physical Exam  Neurovascular status intact with patient who still has discomfort that is improved but gradually seems to be improving and is responding to medication     Assessment:  Patient who has had some chronic tendinitis along with possibility for neuropathic leg pain     Plan:  H&P reviewed both conditions discussed neuropathic versus tenderness like discomfort with inflammation and discussed ice therapy soap therapy anti-inflammatories and shoe gear modifications.  Patient is to be seen back as needed may require more aggressive treatment in future

## 2021-03-09 DIAGNOSIS — M4726 Other spondylosis with radiculopathy, lumbar region: Secondary | ICD-10-CM | POA: Diagnosis not present

## 2021-03-09 DIAGNOSIS — M5106 Intervertebral disc disorders with myelopathy, lumbar region: Secondary | ICD-10-CM | POA: Diagnosis not present

## 2021-03-09 DIAGNOSIS — M4306 Spondylolysis, lumbar region: Secondary | ICD-10-CM | POA: Diagnosis not present

## 2021-03-09 DIAGNOSIS — M5442 Lumbago with sciatica, left side: Secondary | ICD-10-CM | POA: Diagnosis not present

## 2021-05-08 ENCOUNTER — Ambulatory Visit (HOSPITAL_COMMUNITY)
Admission: RE | Admit: 2021-05-08 | Discharge: 2021-05-08 | Disposition: A | Discharge status: Home / Self Care | Payer: 59 | Source: Ambulatory Visit | Attending: Emergency Medicine | Admitting: Emergency Medicine

## 2021-05-08 ENCOUNTER — Encounter (HOSPITAL_COMMUNITY): Payer: Self-pay

## 2021-05-08 ENCOUNTER — Other Ambulatory Visit: Payer: Self-pay

## 2021-05-08 ENCOUNTER — Ambulatory Visit (INDEPENDENT_AMBULATORY_CARE_PROVIDER_SITE_OTHER): Payer: 59

## 2021-05-08 VITALS — BP 117/76 | HR 80 | Temp 98.4°F | Resp 18

## 2021-05-08 DIAGNOSIS — S9032XA Contusion of left foot, initial encounter: Secondary | ICD-10-CM

## 2021-05-08 DIAGNOSIS — M79672 Pain in left foot: Secondary | ICD-10-CM | POA: Diagnosis not present

## 2021-05-08 DIAGNOSIS — S93602A Unspecified sprain of left foot, initial encounter: Secondary | ICD-10-CM | POA: Diagnosis not present

## 2021-05-08 MED ORDER — IBUPROFEN 800 MG PO TABS: 800.0000 mg | ORAL_TABLET | Freq: Three times a day (TID) | ORAL | 0 refills | Status: AC

## 2021-05-08 NOTE — ED Provider Notes (Signed)
[place]    CSN: [id number] Arrival date & time: 05/08/21  1737      History   Chief Complaint Chief Complaint  Patient presents with   Foot Pain    HPI Tracy Mejia is a 35 y.o. female.   Patient here for evaluation of left foot pain and burning that has been ongoing for the past few days.  Reports pain is achy on the top of foot and burning towards ankle that radiates down foot into toes.  Reports pain worse over the past few days and with standing or movement.  Patient does have full range of motion.  Reports history of stress fractures in that foot.  Denies any trauma, injury, or other precipitating event.  Denies any fevers, chest pain, shortness of breath, N/V/D, numbness, tingling, weakness, abdominal pain, or headaches.    The history is provided by the patient.  Foot Pain   Past Medical History:  Diagnosis Date   Asthma    exercise induced   Heart murmur    History of chicken pox    Thyroid disease     Patient Active Problem List   Diagnosis Date Noted   Acute right lower quadrant pain 11/08/2020   Current moderate episode of major depressive disorder without prior episode ([place]) 01/28/2018   Hepatic steatosis 12/09/2017   Irritable bowel syndrome with diarrhea 12/09/2017   Chronic tension-type headache, not intractable 10/05/2015   Excessive daytime sleepiness 10/05/2015   Left knee injury 10/05/2015   Snoring 10/05/2015   Uterine prolapse 01/05/2015   SUI (stress urinary incontinence, female) 12/29/2014   Pharyngoesophageal dysphagia 10/04/2014   Inflammation of joint of right knee 08/31/2014   Anxiety state, unspecified 04/13/2014   Depression 10/15/2012   Headache 10/15/2012   THYROIDITIS 11/12/2008   URINALYSIS, ABNORMAL 11/12/2008   Hyperuricuria 11/12/2008   History of hypothyroidism 12/18/2007   History of endocrine, metabolic or immunity disorder 12/18/2007   PLANTAR WART 10/31/2007   Low back pain 10/31/2007   DISORDER,  ADJUSTMENT W/DEPRESSED MOOD 07/16/2007   ADHD 07/16/2007   Attention-deficit hyperactivity disorder, unspecified type 07/16/2007   Acute actinic otitis externa 07/08/2007   Generalized anxiety disorder 02/12/2005   Mild intermittent asthma 02/12/2005    Past Surgical History:  Procedure Laterality Date   ABDOMINAL HYSTERECTOMY     CHOLECYSTECTOMY  2010   LAPAROSCOPIC APPENDECTOMY N/A 11/08/2020   Procedure: APPENDECTOMY LAPAROSCOPIC;  Surgeon: Ralene Ok, MD;  Location: [facility];  Service: General;  Laterality: N/A;   TUBAL LIGATION  2012   WISDOM TOOTH EXTRACTION  2003    OB History   No obstetric history on file.      Home Medications    Prior to Admission medications   Medication Sig Start Date End Date Taking? Authorizing Provider  gabapentin (NEURONTIN) 100 MG capsule Take 1 capsule (100 mg total) by mouth 3 (three) times daily. 11/21/18  Yes Edrick Kins, DPM  ibuprofen (ADVIL) 800 MG tablet Take 1 tablet (800 mg total) by mouth 3 (three) times daily. 05/08/21  Yes Pearson Forster, NP  albuterol (PROVENTIL HFA;VENTOLIN HFA) 108 (90 BASE) MCG/ACT inhaler Inhale 2 puffs into the lungs every 6 (six) hours as needed. 12/08/12   Debbrah Alar, NP  aspirin-acetaminophen-caffeine (EXCEDRIN MIGRAINE) [phone] MG tablet Take by mouth.    [provider]  dicyclomine (BENTYL) 10 MG capsule TAKE 1 CAPSULE BY MOUTH 4 TIMES DAILY BEFORE MEALS AND NIGHTLY (AT BEDTIME) AS NEEDED. 11/03/20 11/03/21  Lennox Solders,  PA-C  gabapentin (NEURONTIN) 100 MG capsule Take 1 capsule (100 mg total) by mouth 3 (three) times daily. 02/23/21   Wallene Huh, DPM  hydrOXYzine (ATARAX/VISTARIL) 25 MG tablet Take 1 tablet (25 mg total) by mouth 3 (three) times daily as needed for Anxiety. 01/05/21     ondansetron (ZOFRAN) 4 MG tablet Take 1 tablet (4 mg total) by mouth every 6 (six) hours. 02/22/21   White, Leitha Schuller, NP  oxyCODONE (OXY IR/ROXICODONE) 5 MG immediate release tablet Take 1  tablet (5 mg total) by mouth every 6 (six) hours as needed for moderate pain (pain not releived by tylenol and ibuprofen). 11/09/20   Jill Alexanders, PA-C  colchicine 0.6 MG tablet Take 1 tablet (0.6 mg total) by mouth daily. 07/23/18 11/07/20  Edrick Kins, DPM  DULoxetine (CYMBALTA) 30 MG capsule Take one capsule daily for one week then take 2 capsules daily 08/01/16 11/07/20  [provider]  ferrous fumarate (HEMOCYTE - 106 MG FE) 325 (106 FE) MG TABS Take 1 tablet by mouth.  11/07/20  [provider]  fluticasone (FLONASE) 50 MCG/ACT nasal spray 2 sprays by Each Nare route daily. 07/11/16 11/07/20  [provider]  hyoscyamine (LEVSIN SL) 0.125 MG SL tablet Place under the tongue. 12/27/16 11/07/20  [provider]  sertraline (ZOLOFT) 50 MG tablet 1/2 tab by mouth once daily for 1 week, then increase to a full tab once daily on week two. Patient not taking: Reported on 11/07/2020 04/13/14 11/07/20  Debbrah Alar, NP  topiramate (TOPAMAX) 25 MG tablet Titrate 1 tab QHS 1st wk, 2 tabs QHS 2nd wk, 3 tabs QHS 3rd wk, 4 tabs QHS 4th wk 12/27/16 11/07/20  [provider]  traZODone (DESYREL) 50 MG tablet Take 1/2 tablet at bedtime for one week then take one tablet at bedtime 08/01/16 11/07/20  [provider]    Family History Family History  Problem Relation Age of Onset   Cancer Paternal Grandfather        melanoma, prostate cancer   Diabetes Mother    Hypertension Mother    Cancer Mother        breast   Stroke Father    Factor V Leiden deficiency Father    Heart disease Neg Hx     Social History Social History   Tobacco Use   Smoking status: Former    Types: Cigarettes    Quit date: 10/08/2020    Years since quitting: 0.5   Smokeless tobacco: Never   Tobacco comments:    4-5 (1 pack weekly) cigarettes daily  Vaping Use   Vaping Use: Never used  Substance Use Topics   Alcohol use: Yes    Comment: occasional   Drug use: Never      Allergies   Cortisone, Morphine, and Liraglutide   Review of Systems Review of Systems  Musculoskeletal:  Positive for arthralgias and joint swelling.  All other systems reviewed and are negative.   Physical Exam Triage Vital Signs ED Triage Vitals  Enc Vitals Group     BP 05/08/21 1821 117/76     Pulse Rate 05/08/21 1821 80     Resp 05/08/21 1821 18     Temp 05/08/21 1821 98.4 F (36.9 C)     Temp Source 05/08/21 1821 Oral     SpO2 05/08/21 1821 100 %     Weight --      Height --      Head Circumference --  Peak Flow --      Pain Score 05/08/21 1818 8     Pain Loc --      Pain Edu? --      Excl. in [place]? --    No data found.  Updated Vital Signs BP 117/76 (BP Location: Right Arm)   Pulse 80   Temp 98.4 F (36.9 C) (Oral)   Resp 18   LMP 03/24/2014   SpO2 100%   Visual Acuity Right Eye Distance:   Left Eye Distance:   Bilateral Distance:    Right Eye Near:   Left Eye Near:    Bilateral Near:     Physical Exam Vitals and nursing note reviewed.  Constitutional:      General: She is not in acute distress.    Appearance: Normal appearance. She is not ill-appearing, toxic-appearing or diaphoretic.  HENT:     Head: Normocephalic and atraumatic.  Eyes:     Conjunctiva/sclera: Conjunctivae normal.  Cardiovascular:     Rate and Rhythm: Normal rate.     Pulses: Normal pulses.  Pulmonary:     Effort: Pulmonary effort is normal.  Abdominal:     General: Abdomen is flat.  Musculoskeletal:        General: Normal range of motion.     Cervical back: Normal range of motion.     Left foot: Normal range of motion and normal capillary refill. Tenderness and bony tenderness present. No swelling, deformity, laceration or crepitus. Normal pulse.  Skin:    General: Skin is warm and dry.  Neurological:     General: No focal deficit present.     Mental Status: She is alert and oriented to person, place, and time.  Psychiatric:        Mood and Affect: Mood  normal.     UC Treatments / Results  Labs (all labs ordered are listed, but only abnormal results are displayed) Labs Reviewed - No data to display  EKG   Radiology DG Foot Complete Left  Result Date: 05/08/2021 CLINICAL DATA:  Left foot pain. EXAM: LEFT FOOT - COMPLETE 3+ VIEW COMPARISON:  Left foot plain films, dated April 10, 2015 and July 23, 2018. MR ankle dated November 13, 2018. FINDINGS: There is no evidence of fracture or dislocation. There is a large plantar calcaneal spur. An accessory ossicle is seen involving the left navicular bone. There is no evidence of arthropathy or other focal bone abnormality. Soft tissues are unremarkable. IMPRESSION: No acute osseous abnormality. Electronically Signed   By: Virgina Norfolk M.D.   On: 05/08/2021 19:23    Procedures Procedures (including critical care time)  Medications Ordered in UC Medications - No data to display  Initial Impression / Assessment and Plan / UC Course  I have reviewed the triage vital signs and the nursing notes.  Pertinent labs & imaging results that were available during my care of the patient were reviewed by me and considered in my medical decision making (see chart for details).    Assessment negative for red flags or concerns.  Xray with no acute bony abnormality.  Left foot sprain and contusion.  Ace bandage applied in office and may wear for comfort.  Ibuprofen prescribed for pain relief.  Recommend rest, ice, compression, and elevation.  Follow up with orthopedics if symptoms do not improve in the next few days.  Final Clinical Impressions(s) / UC Diagnoses   Final diagnoses:  Contusion of left foot, initial encounter  Sprain of left foot,  initial encounter     Discharge Instructions      Take the Ibuprofen 3 times a day for the next few days and then as needed for pain. You can also take Tylenol.   Wear the ace bandage for comfort, especially when standing or walking frequently.   Rest  as much as possible Ice for 10-15 minutes every 4-6 hours as needed for pain and swelling Compression- use an ace bandage or splint for comfort Elevate above your hip/heart when sitting and laying down  Follow up with sports medicine or orthopedics if symptoms do not improve in the next few days.      ED Prescriptions     Medication Sig Dispense Auth. Provider   ibuprofen (ADVIL) 800 MG tablet Take 1 tablet (800 mg total) by mouth 3 (three) times daily. 21 tablet Pearson Forster, NP      PDMP not reviewed this encounter.   Pearson Forster, NP 05/08/21 2106

## 2021-05-08 NOTE — ED Triage Notes (Signed)
Pt c/o pain in left ankle and toes, she is able to move toes. Started 2 days ago

## 2021-05-08 NOTE — Discharge Instructions (Addendum)
Take the Ibuprofen 3 times a day for the next few days and then as needed for pain. You can also take Tylenol.   Wear the ace bandage for comfort, especially when standing or walking frequently.   Rest as much as possible Ice for 10-15 minutes every 4-6 hours as needed for pain and swelling Compression- use an ace bandage or splint for comfort Elevate above your hip/heart when sitting and laying down  Follow up with sports medicine or orthopedics if symptoms do not improve in the next few days.

## 2021-06-22 DIAGNOSIS — Z23 Encounter for immunization: Secondary | ICD-10-CM | POA: Diagnosis not present

## 2021-06-29 DIAGNOSIS — R0602 Shortness of breath: Secondary | ICD-10-CM | POA: Diagnosis not present

## 2021-06-30 DIAGNOSIS — R0602 Shortness of breath: Secondary | ICD-10-CM | POA: Diagnosis not present

## 2021-06-30 DIAGNOSIS — J45909 Unspecified asthma, uncomplicated: Secondary | ICD-10-CM | POA: Diagnosis not present

## 2021-07-07 DIAGNOSIS — R0602 Shortness of breath: Secondary | ICD-10-CM | POA: Diagnosis not present

## 2021-07-12 ENCOUNTER — Encounter: Payer: Self-pay | Admitting: Psychology

## 2021-07-14 DIAGNOSIS — M5442 Lumbago with sciatica, left side: Secondary | ICD-10-CM | POA: Diagnosis not present

## 2021-07-14 DIAGNOSIS — Z4689 Encounter for fitting and adjustment of other specified devices: Secondary | ICD-10-CM | POA: Diagnosis not present

## 2021-07-14 DIAGNOSIS — M5431 Sciatica, right side: Secondary | ICD-10-CM | POA: Diagnosis not present

## 2021-07-14 DIAGNOSIS — M4726 Other spondylosis with radiculopathy, lumbar region: Secondary | ICD-10-CM | POA: Diagnosis not present

## 2021-07-14 DIAGNOSIS — M4306 Spondylolysis, lumbar region: Secondary | ICD-10-CM | POA: Diagnosis not present

## 2021-07-14 DIAGNOSIS — M5106 Intervertebral disc disorders with myelopathy, lumbar region: Secondary | ICD-10-CM | POA: Diagnosis not present

## 2021-07-19 DIAGNOSIS — M4306 Spondylolysis, lumbar region: Secondary | ICD-10-CM | POA: Diagnosis not present

## 2021-07-19 DIAGNOSIS — Z87891 Personal history of nicotine dependence: Secondary | ICD-10-CM | POA: Diagnosis not present

## 2021-07-19 DIAGNOSIS — M5442 Lumbago with sciatica, left side: Secondary | ICD-10-CM | POA: Diagnosis not present

## 2021-07-19 DIAGNOSIS — M5106 Intervertebral disc disorders with myelopathy, lumbar region: Secondary | ICD-10-CM | POA: Diagnosis not present

## 2021-07-19 DIAGNOSIS — Z01812 Encounter for preprocedural laboratory examination: Secondary | ICD-10-CM | POA: Diagnosis not present

## 2021-07-19 DIAGNOSIS — M4726 Other spondylosis with radiculopathy, lumbar region: Secondary | ICD-10-CM | POA: Diagnosis not present

## 2021-07-20 DIAGNOSIS — M4726 Other spondylosis with radiculopathy, lumbar region: Secondary | ICD-10-CM | POA: Diagnosis not present

## 2021-07-20 DIAGNOSIS — M5106 Intervertebral disc disorders with myelopathy, lumbar region: Secondary | ICD-10-CM | POA: Diagnosis not present

## 2021-07-20 DIAGNOSIS — Z9889 Other specified postprocedural states: Secondary | ICD-10-CM | POA: Diagnosis not present

## 2021-07-20 DIAGNOSIS — M48062 Spinal stenosis, lumbar region with neurogenic claudication: Secondary | ICD-10-CM | POA: Diagnosis not present

## 2021-07-20 DIAGNOSIS — M545 Low back pain, unspecified: Secondary | ICD-10-CM | POA: Diagnosis not present

## 2021-07-20 DIAGNOSIS — M4326 Fusion of spine, lumbar region: Secondary | ICD-10-CM | POA: Diagnosis not present

## 2021-07-20 DIAGNOSIS — Z981 Arthrodesis status: Secondary | ICD-10-CM | POA: Diagnosis not present

## 2021-07-20 DIAGNOSIS — M48061 Spinal stenosis, lumbar region without neurogenic claudication: Secondary | ICD-10-CM | POA: Diagnosis not present

## 2021-07-20 DIAGNOSIS — M4306 Spondylolysis, lumbar region: Secondary | ICD-10-CM | POA: Diagnosis not present

## 2021-07-21 DIAGNOSIS — Z981 Arthrodesis status: Secondary | ICD-10-CM | POA: Diagnosis not present

## 2021-07-21 DIAGNOSIS — Z419 Encounter for procedure for purposes other than remedying health state, unspecified: Secondary | ICD-10-CM | POA: Diagnosis not present

## 2021-07-21 DIAGNOSIS — M5442 Lumbago with sciatica, left side: Secondary | ICD-10-CM | POA: Diagnosis not present

## 2021-07-21 DIAGNOSIS — Z9049 Acquired absence of other specified parts of digestive tract: Secondary | ICD-10-CM | POA: Diagnosis not present

## 2021-07-21 DIAGNOSIS — M4306 Spondylolysis, lumbar region: Secondary | ICD-10-CM | POA: Diagnosis not present

## 2021-07-21 DIAGNOSIS — M545 Low back pain, unspecified: Secondary | ICD-10-CM | POA: Diagnosis not present

## 2021-07-21 DIAGNOSIS — Z9889 Other specified postprocedural states: Secondary | ICD-10-CM | POA: Diagnosis not present

## 2021-07-21 DIAGNOSIS — M4726 Other spondylosis with radiculopathy, lumbar region: Secondary | ICD-10-CM | POA: Diagnosis not present

## 2021-07-21 DIAGNOSIS — M48061 Spinal stenosis, lumbar region without neurogenic claudication: Secondary | ICD-10-CM | POA: Diagnosis not present

## 2022-01-18 ENCOUNTER — Ambulatory Visit: Payer: 59 | Admitting: Psychology

## 2022-01-23 IMAGING — DX DG FOOT COMPLETE 3+V*L*
3 series · 3 of 3 positions shown · non-contrast
Comparison: Left foot plain films, dated April 10, 2015 and July 23, 2018. MR ankle dated November 13, 2018.

CLINICAL DATA: Left foot pain.

EXAM:
LEFT FOOT - COMPLETE 3+ VIEW

[foot ap]
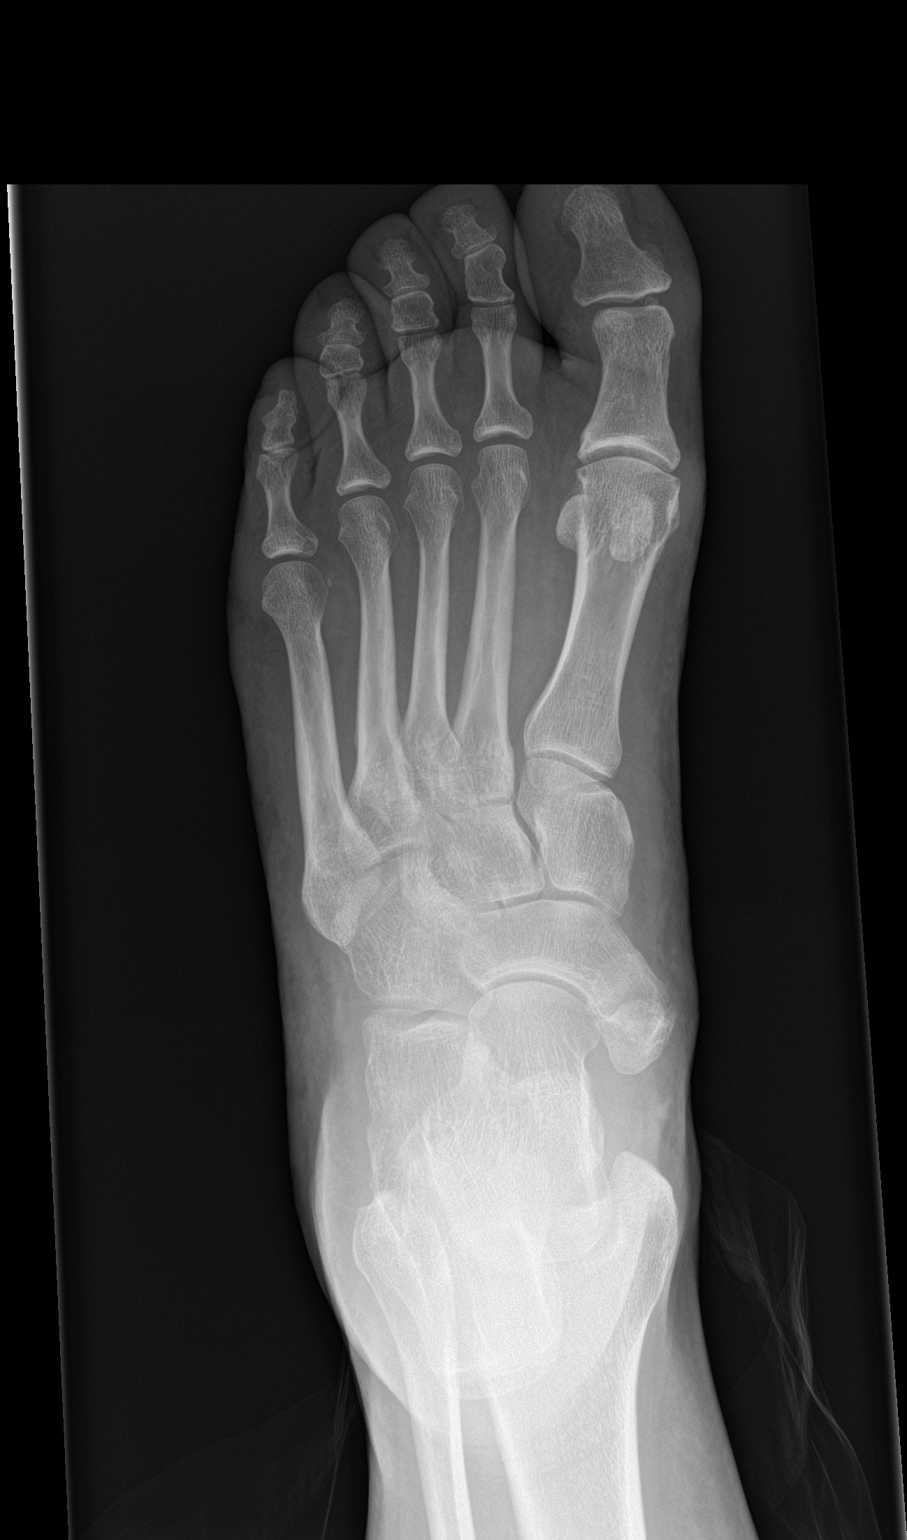

[foot obl]
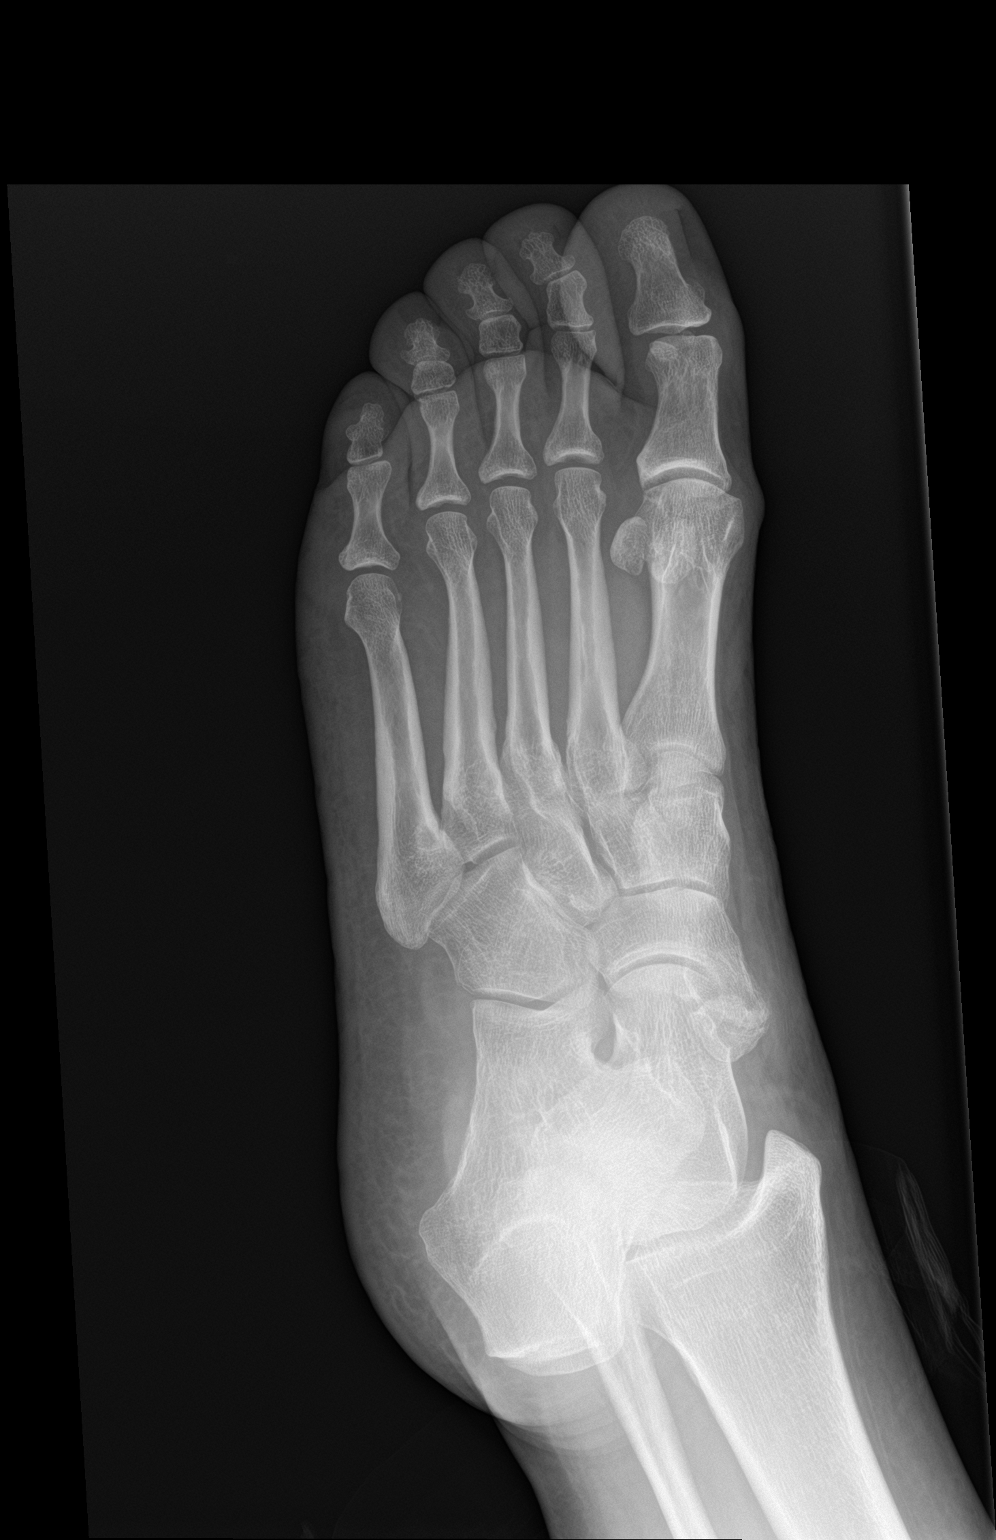

[foot lat]
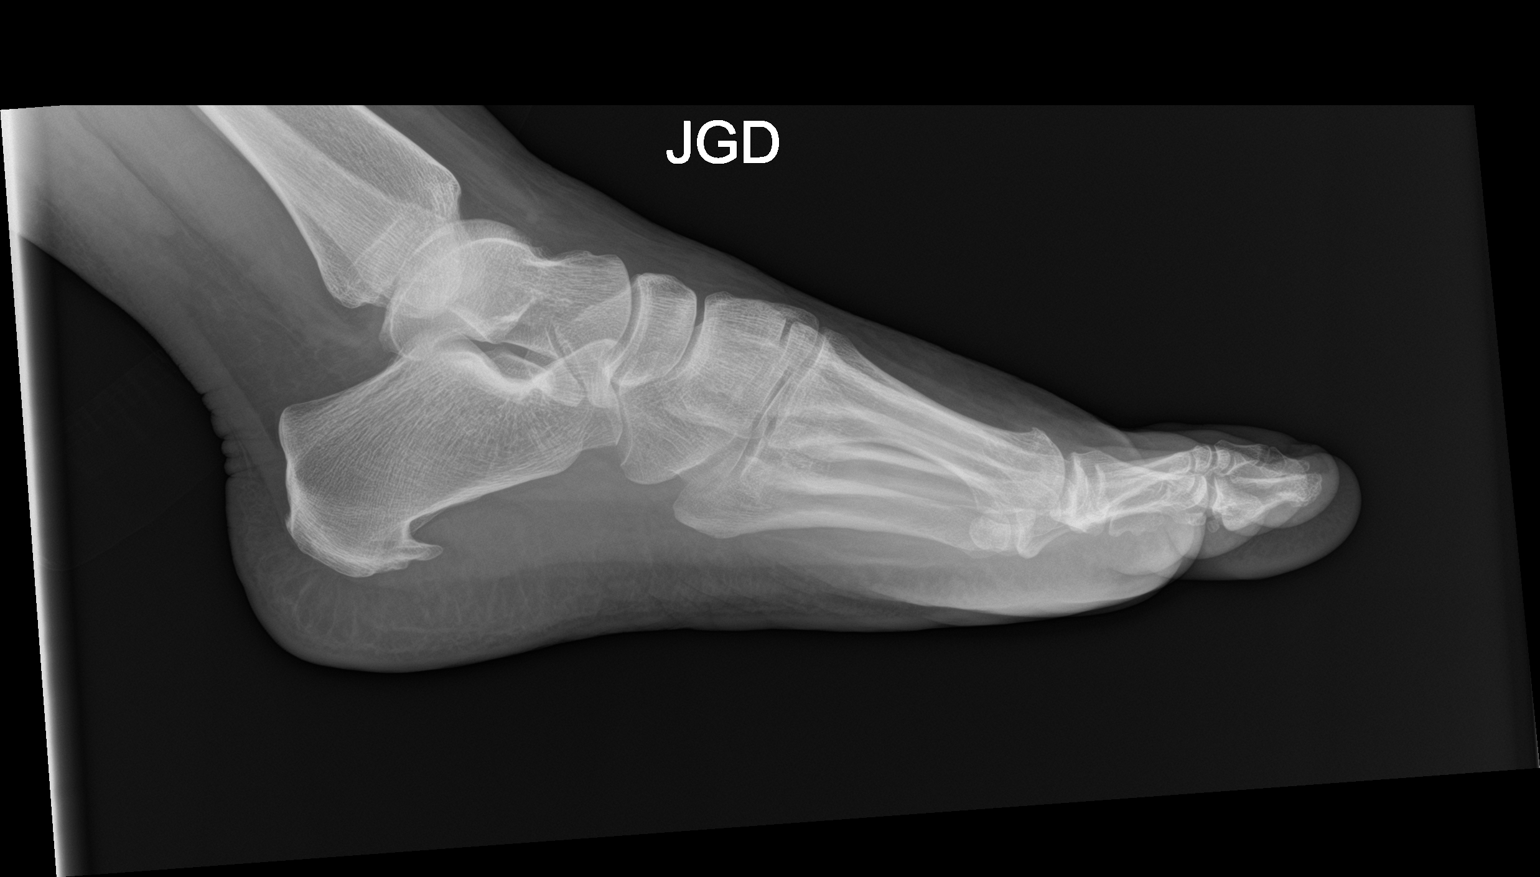

[3 of 3 positions shown; findings below may reference images not displayed]

FINDINGS: There is no evidence of fracture or dislocation. There is a large
plantar calcaneal spur. An accessory ossicle is seen involving the
left navicular bone. There is no evidence of arthropathy or other
focal bone abnormality. Soft tissues are unremarkable.
IMPRESSION: No acute osseous abnormality.
